# Patient Record
Sex: Male | Born: 1955 | Race: White | Hispanic: No | Marital: Married | Smoking: Former smoker
Health system: Southern US, Community
[De-identification: ages and names within clinical notes are randomized; demographics above are authoritative.]

## PROBLEM LIST (undated history)

## (undated) DIAGNOSIS — E669 Obesity, unspecified: Secondary | ICD-10-CM

## (undated) DIAGNOSIS — E119 Type 2 diabetes mellitus without complications: Secondary | ICD-10-CM

## (undated) DIAGNOSIS — I10 Essential (primary) hypertension: Secondary | ICD-10-CM

## (undated) DIAGNOSIS — C419 Malignant neoplasm of bone and articular cartilage, unspecified: Secondary | ICD-10-CM

## (undated) DIAGNOSIS — K219 Gastro-esophageal reflux disease without esophagitis: Secondary | ICD-10-CM

## (undated) HISTORY — DX: Essential (primary) hypertension: I10

## (undated) HISTORY — DX: Obesity, unspecified: E66.9

## (undated) HISTORY — PX: SKIN GRAFT FULL THICKNESS LEG: SUR1299

## (undated) HISTORY — PX: CHOLECYSTECTOMY: SHX55

## (undated) HISTORY — DX: Malignant neoplasm of bone and articular cartilage, unspecified: C41.9

## (undated) HISTORY — DX: Type 2 diabetes mellitus without complications: E11.9

## (undated) HISTORY — DX: Gastro-esophageal reflux disease without esophagitis: K21.9

## (undated) HISTORY — PX: HERNIA REPAIR: SHX51

---

## 1999-01-31 ENCOUNTER — Ambulatory Visit (HOSPITAL_COMMUNITY): Admission: RE | Admit: 1999-01-31 | Discharge: 1999-01-31 | Payer: Self-pay | Admitting: Internal Medicine

## 1999-05-12 ENCOUNTER — Encounter: Payer: Self-pay | Admitting: Internal Medicine

## 1999-05-12 ENCOUNTER — Ambulatory Visit (HOSPITAL_COMMUNITY): Admission: RE | Admit: 1999-05-12 | Discharge: 1999-05-12 | Payer: Self-pay | Admitting: Internal Medicine

## 1999-10-14 ENCOUNTER — Ambulatory Visit (HOSPITAL_COMMUNITY): Admission: RE | Admit: 1999-10-14 | Discharge: 1999-10-14 | Payer: Self-pay | Admitting: Internal Medicine

## 1999-10-14 ENCOUNTER — Encounter: Payer: Self-pay | Admitting: Internal Medicine

## 2000-04-26 ENCOUNTER — Encounter: Payer: Self-pay | Admitting: Internal Medicine

## 2000-04-26 ENCOUNTER — Ambulatory Visit (HOSPITAL_COMMUNITY): Admission: RE | Admit: 2000-04-26 | Discharge: 2000-04-26 | Payer: Self-pay | Admitting: Internal Medicine

## 2000-07-04 ENCOUNTER — Ambulatory Visit (HOSPITAL_COMMUNITY): Admission: RE | Admit: 2000-07-04 | Discharge: 2000-07-04 | Payer: Self-pay | Admitting: Neurology

## 2000-07-04 ENCOUNTER — Encounter: Payer: Self-pay | Admitting: Neurology

## 2000-07-13 ENCOUNTER — Ambulatory Visit (HOSPITAL_BASED_OUTPATIENT_CLINIC_OR_DEPARTMENT_OTHER): Admission: RE | Admit: 2000-07-13 | Discharge: 2000-07-13 | Payer: Self-pay | Admitting: Neurology

## 2000-08-08 ENCOUNTER — Encounter: Admission: RE | Admit: 2000-08-08 | Discharge: 2000-11-06 | Payer: Self-pay | Admitting: Pulmonary Disease

## 2000-11-10 ENCOUNTER — Encounter: Payer: Self-pay | Admitting: Internal Medicine

## 2000-11-10 ENCOUNTER — Ambulatory Visit (HOSPITAL_BASED_OUTPATIENT_CLINIC_OR_DEPARTMENT_OTHER): Admission: RE | Admit: 2000-11-10 | Discharge: 2000-11-10 | Payer: Self-pay | Admitting: Pulmonary Disease

## 2001-02-04 ENCOUNTER — Encounter: Admission: RE | Admit: 2001-02-04 | Discharge: 2001-05-05 | Payer: Self-pay | Admitting: Pulmonary Disease

## 2001-05-19 ENCOUNTER — Encounter: Admission: RE | Admit: 2001-05-19 | Discharge: 2001-08-17 | Payer: Self-pay | Admitting: Pulmonary Disease

## 2001-08-07 ENCOUNTER — Encounter: Payer: Self-pay | Admitting: Internal Medicine

## 2001-08-07 ENCOUNTER — Encounter: Admission: RE | Admit: 2001-08-07 | Discharge: 2001-08-07 | Payer: Self-pay | Admitting: Internal Medicine

## 2001-08-15 ENCOUNTER — Encounter: Admission: RE | Admit: 2001-08-15 | Discharge: 2001-11-13 | Payer: Self-pay | Admitting: Pulmonary Disease

## 2002-01-12 ENCOUNTER — Encounter: Admission: RE | Admit: 2002-01-12 | Discharge: 2002-04-12 | Payer: Self-pay | Admitting: Pulmonary Disease

## 2005-01-18 ENCOUNTER — Encounter: Admission: RE | Admit: 2005-01-18 | Discharge: 2005-04-18 | Payer: Self-pay | Admitting: Internal Medicine

## 2006-04-11 ENCOUNTER — Ambulatory Visit (HOSPITAL_COMMUNITY): Payer: Self-pay | Admitting: Psychiatry

## 2006-04-20 ENCOUNTER — Encounter: Payer: Self-pay | Admitting: Vascular Surgery

## 2006-04-20 ENCOUNTER — Inpatient Hospital Stay (HOSPITAL_COMMUNITY): Admission: EM | Admit: 2006-04-20 | Discharge: 2006-04-27 | Payer: Self-pay | Admitting: Emergency Medicine

## 2006-04-22 ENCOUNTER — Encounter (INDEPENDENT_AMBULATORY_CARE_PROVIDER_SITE_OTHER): Payer: Self-pay | Admitting: Cardiovascular Disease

## 2006-04-23 ENCOUNTER — Encounter (INDEPENDENT_AMBULATORY_CARE_PROVIDER_SITE_OTHER): Payer: Self-pay | Admitting: *Deleted

## 2006-05-06 ENCOUNTER — Ambulatory Visit (HOSPITAL_COMMUNITY): Payer: Self-pay | Admitting: Psychiatry

## 2006-07-08 ENCOUNTER — Ambulatory Visit (HOSPITAL_COMMUNITY): Payer: Self-pay | Admitting: Psychiatry

## 2006-08-16 ENCOUNTER — Encounter (HOSPITAL_BASED_OUTPATIENT_CLINIC_OR_DEPARTMENT_OTHER): Admission: RE | Admit: 2006-08-16 | Discharge: 2006-09-04 | Payer: Self-pay | Admitting: Internal Medicine

## 2006-09-01 ENCOUNTER — Inpatient Hospital Stay (HOSPITAL_COMMUNITY): Admission: EM | Admit: 2006-09-01 | Discharge: 2006-09-05 | Payer: Self-pay | Admitting: Emergency Medicine

## 2006-10-07 ENCOUNTER — Ambulatory Visit (HOSPITAL_COMMUNITY): Payer: Self-pay | Admitting: Psychiatry

## 2007-01-08 ENCOUNTER — Ambulatory Visit (HOSPITAL_COMMUNITY): Admission: RE | Admit: 2007-01-08 | Discharge: 2007-01-09 | Payer: Self-pay | Admitting: General Surgery

## 2007-01-08 ENCOUNTER — Encounter (INDEPENDENT_AMBULATORY_CARE_PROVIDER_SITE_OTHER): Payer: Self-pay | Admitting: Specialist

## 2007-01-23 ENCOUNTER — Ambulatory Visit (HOSPITAL_COMMUNITY): Admission: RE | Admit: 2007-01-23 | Discharge: 2007-01-23 | Payer: Self-pay | Admitting: Internal Medicine

## 2007-01-23 ENCOUNTER — Encounter: Payer: Self-pay | Admitting: Vascular Surgery

## 2007-02-24 ENCOUNTER — Ambulatory Visit (HOSPITAL_COMMUNITY): Payer: Self-pay | Admitting: Psychiatry

## 2007-03-28 ENCOUNTER — Encounter (HOSPITAL_BASED_OUTPATIENT_CLINIC_OR_DEPARTMENT_OTHER): Admission: RE | Admit: 2007-03-28 | Discharge: 2007-04-30 | Payer: Self-pay | Admitting: Surgery

## 2007-05-20 ENCOUNTER — Ambulatory Visit (HOSPITAL_COMMUNITY): Payer: Self-pay | Admitting: Psychiatry

## 2007-10-16 ENCOUNTER — Ambulatory Visit (HOSPITAL_COMMUNITY): Payer: Self-pay | Admitting: Psychiatry

## 2007-11-06 ENCOUNTER — Ambulatory Visit: Payer: Self-pay | Admitting: Pulmonary Disease

## 2007-11-11 ENCOUNTER — Ambulatory Visit (HOSPITAL_COMMUNITY): Admission: RE | Admit: 2007-11-11 | Discharge: 2007-11-11 | Payer: Self-pay | Admitting: Pulmonary Disease

## 2007-11-19 ENCOUNTER — Ambulatory Visit (HOSPITAL_COMMUNITY): Payer: Self-pay | Admitting: Psychiatry

## 2007-11-20 ENCOUNTER — Telehealth (INDEPENDENT_AMBULATORY_CARE_PROVIDER_SITE_OTHER): Payer: Self-pay | Admitting: *Deleted

## 2007-11-20 ENCOUNTER — Encounter: Payer: Self-pay | Admitting: Pulmonary Disease

## 2008-01-16 DIAGNOSIS — F329 Major depressive disorder, single episode, unspecified: Secondary | ICD-10-CM

## 2008-01-16 DIAGNOSIS — G4730 Sleep apnea, unspecified: Secondary | ICD-10-CM | POA: Insufficient documentation

## 2008-01-19 ENCOUNTER — Ambulatory Visit: Payer: Self-pay | Admitting: Pulmonary Disease

## 2008-02-17 ENCOUNTER — Ambulatory Visit (HOSPITAL_COMMUNITY): Payer: Self-pay | Admitting: Psychiatry

## 2008-07-15 ENCOUNTER — Ambulatory Visit (HOSPITAL_COMMUNITY): Payer: Self-pay | Admitting: Psychiatry

## 2008-11-10 ENCOUNTER — Ambulatory Visit (HOSPITAL_COMMUNITY): Payer: Self-pay | Admitting: Psychiatry

## 2009-02-28 ENCOUNTER — Encounter: Admission: RE | Admit: 2009-02-28 | Discharge: 2009-02-28 | Payer: Self-pay | Admitting: Internal Medicine

## 2009-03-01 ENCOUNTER — Inpatient Hospital Stay (HOSPITAL_COMMUNITY): Admission: AD | Admit: 2009-03-01 | Discharge: 2009-03-04 | Payer: Self-pay | Admitting: Internal Medicine

## 2009-05-13 ENCOUNTER — Ambulatory Visit (HOSPITAL_COMMUNITY): Payer: Self-pay | Admitting: Psychiatry

## 2009-05-26 ENCOUNTER — Inpatient Hospital Stay (HOSPITAL_COMMUNITY): Admission: EM | Admit: 2009-05-26 | Discharge: 2009-06-01 | Payer: Self-pay | Admitting: Emergency Medicine

## 2010-01-13 ENCOUNTER — Ambulatory Visit (HOSPITAL_COMMUNITY): Payer: Self-pay | Admitting: Psychiatry

## 2010-02-14 ENCOUNTER — Ambulatory Visit (HOSPITAL_COMMUNITY): Payer: Self-pay | Admitting: Psychiatry

## 2010-02-26 IMAGING — CT CT ABDOMEN W/ CM
2 of 5 series · 17 of 46 positions shown, 19 images · IV contrast (APPLIED)
Comparison: None available.

CLINICAL DATA: Abdominal pain.  Low grade fever.  Known hernia.

CT ABDOMEN WITH CONTRAST
TECHNIQUE: Multidetector CT imaging of the abdomen was performed
following the standard protocol during bolus administration of
intravenous contrast.
Contrast: 100 ml Fmnipaque-IDD.

[Series 2: abd/ with 5.0 b31f st · axial · 0.98mm/px · z∈[-264,+32]mm · 14 of 69 slices shown, 16 images]
[im 5/69  soft-tissue]
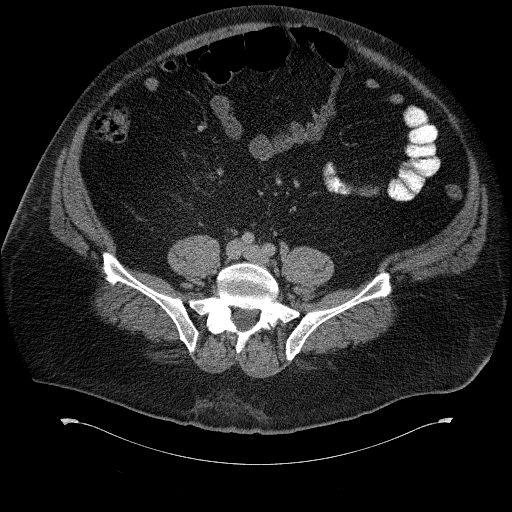
[im 5/69  bone]
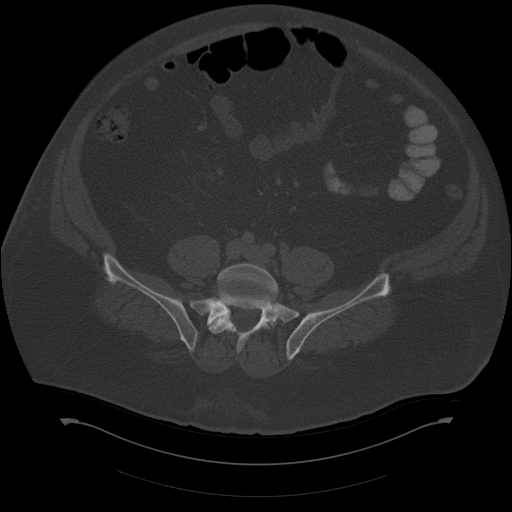
[im 10/69  soft-tissue]
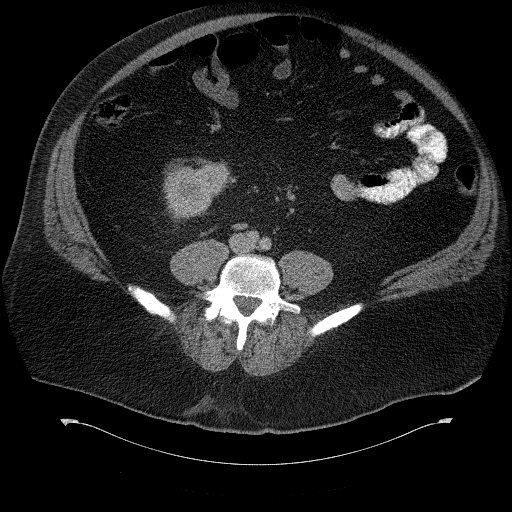
[im 14/69  soft-tissue]
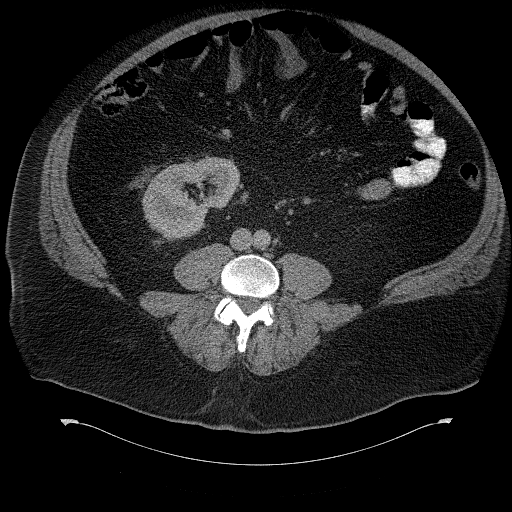
[im 19/69  soft-tissue]
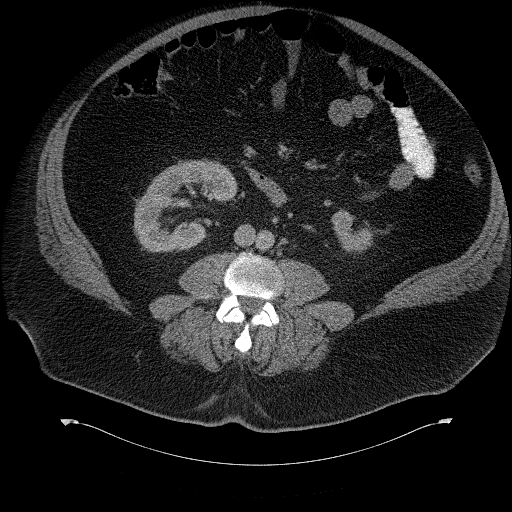
[im 23/69  soft-tissue]
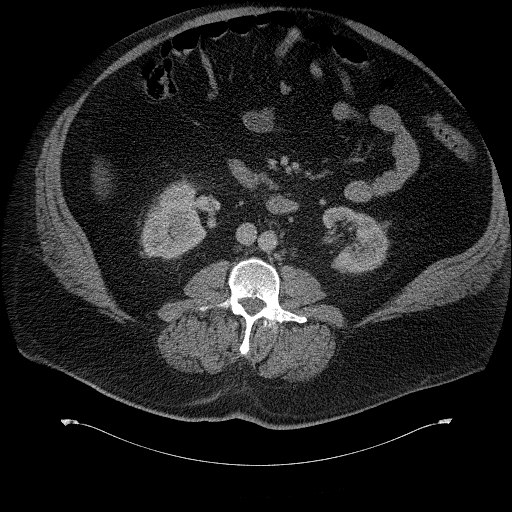
[im 28/69  soft-tissue]
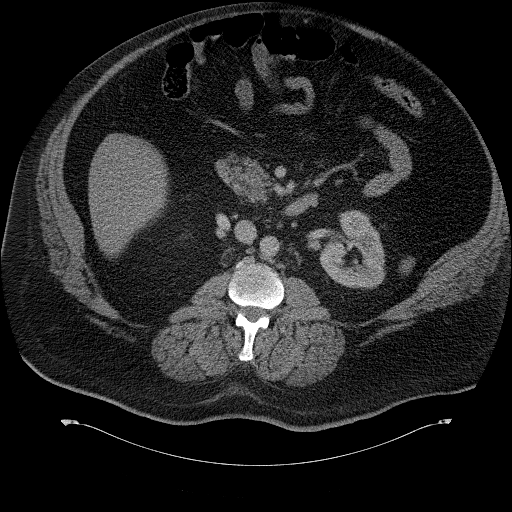
[im 32/69  soft-tissue]
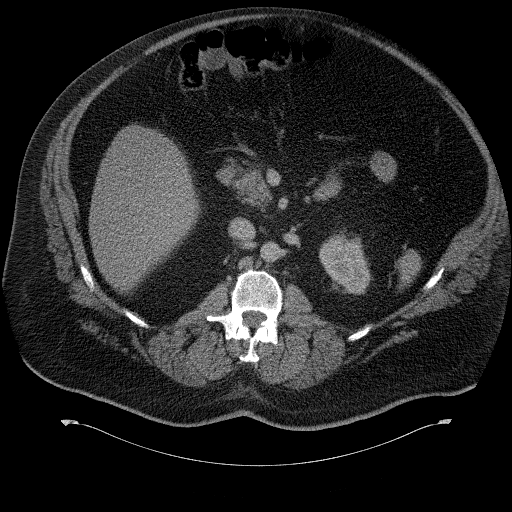
[im 37/69  soft-tissue]
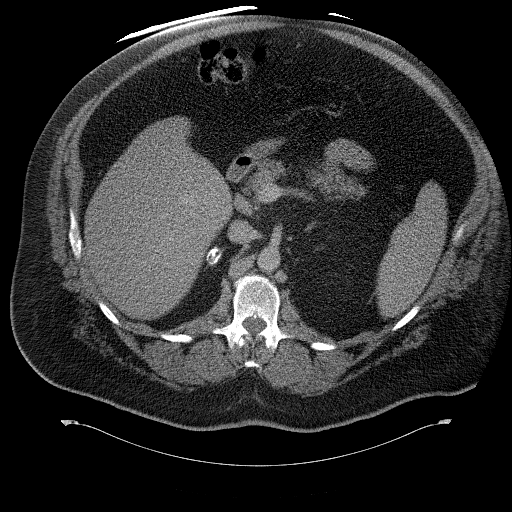
[im 41/69  soft-tissue]
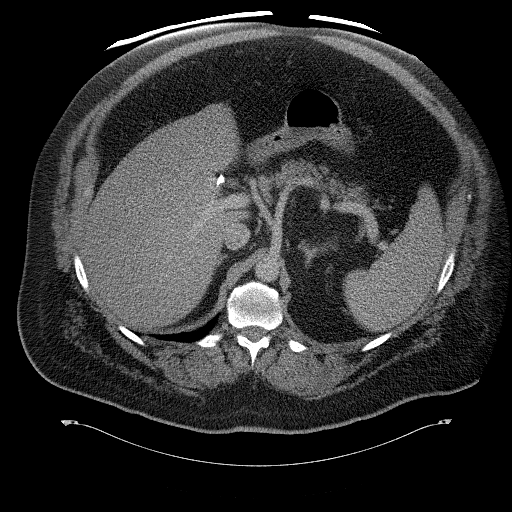
[im 41/69  bone]
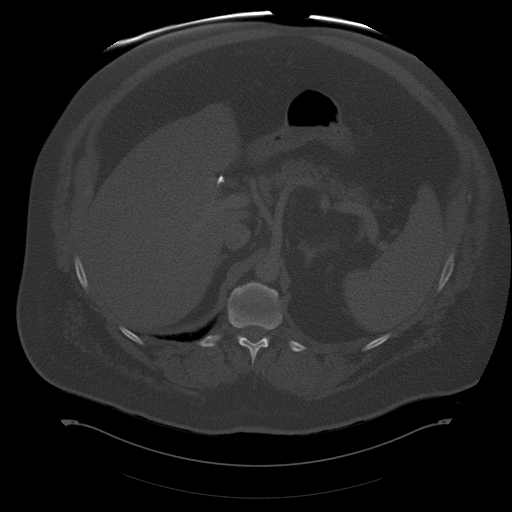
[im 46/69  soft-tissue]
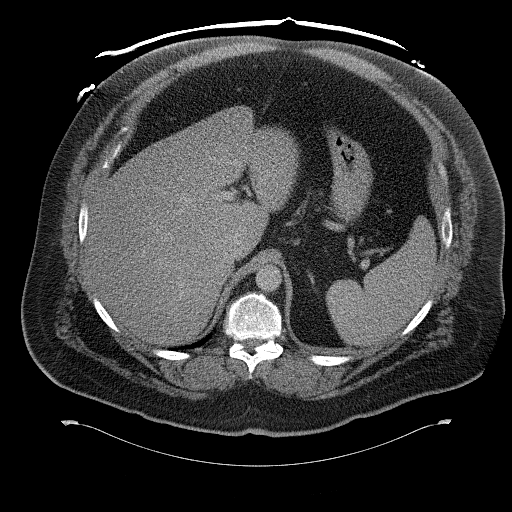
[im 50/69  soft-tissue]
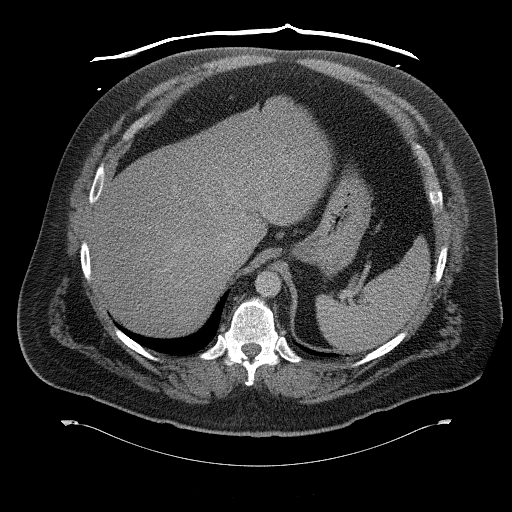
[im 55/69  soft-tissue]
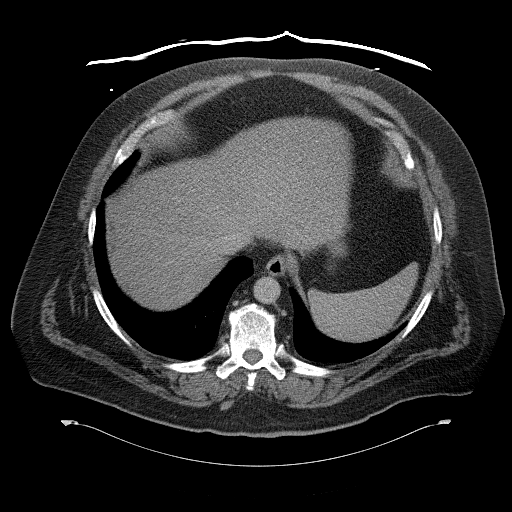
[im 59/69  soft-tissue]
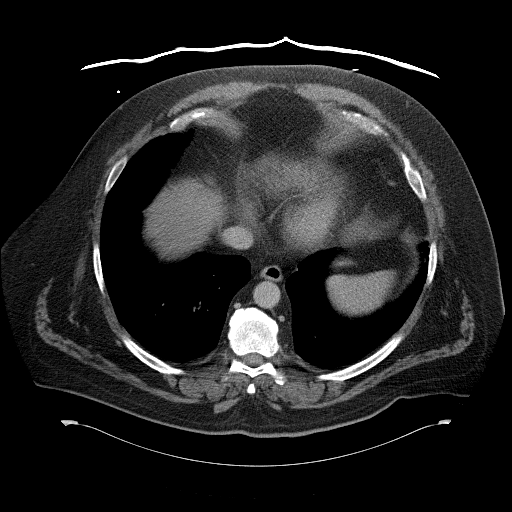
[im 64/69  soft-tissue]
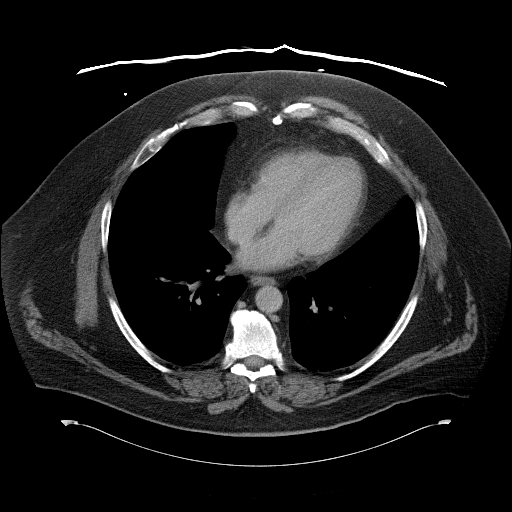

[Series 6: abd/ with 3.0 spo cor st · coronal · 0.98mm/px · 3 of 126 slices shown]
[im 42/126  soft-tissue]
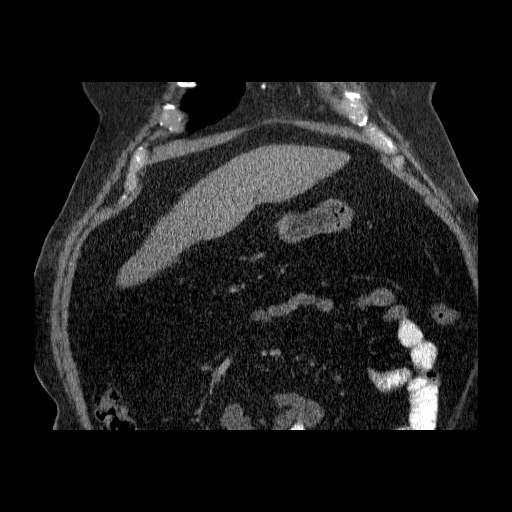
[im 56/126  soft-tissue]
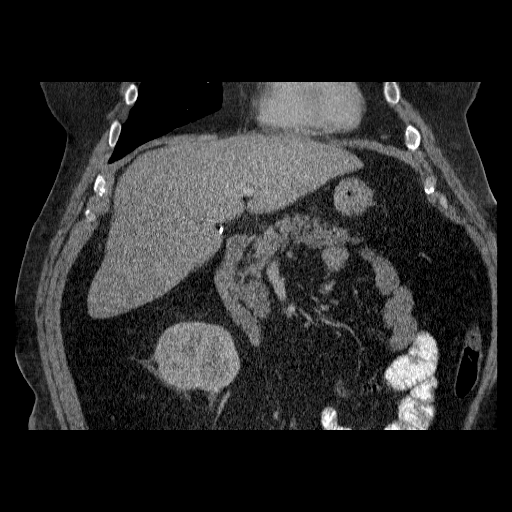
[im 70/126  soft-tissue]
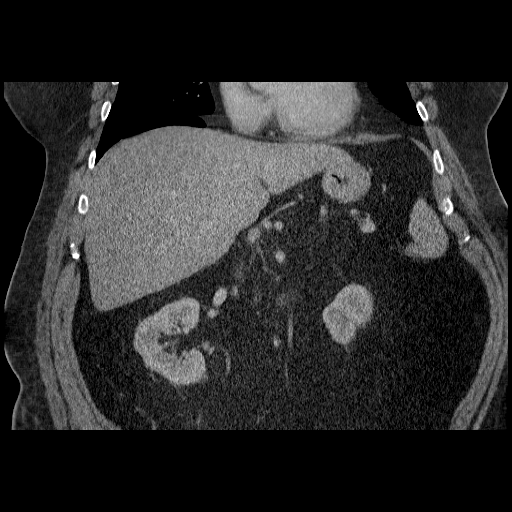

[17 of 46 positions shown; findings below may reference images not displayed]

FINDINGS: The lung bases are clear.  There is no pleural or
pericardial effusion.

No hernia is identified.  The patient is status post
cholecystectomy.  The liver, biliary tree, left adrenal gland,
spleen, pancreas and kidneys are unremarkable.  There is
calcification of the right adrenal gland consistent with the prior
infection or hemorrhage.  No abdominal lymphadenopathy or fluid.
Stomach and visualized bowel loops appear normal.  Degenerative
disease is seen in the lumbar spine with facet arthropathy at L5-S1
noted.
IMPRESSION: 1.  No hernia is identified.  No acute finding.
2.  Status post cholecystectomy.
3.  Calcification of the right adrenal gland consistent with remote
hemorrhage or infection.

## 2010-02-27 IMAGING — CT CT PELVIS W/ CM
1 series · 16 of 32 positions shown, 20 images · IV contrast (APPLIED)
Comparison: Ultrasound 03/02/2009

CLINICAL DATA: Abdominal pain.  Hernia.  Sore palpable abnormality
near the umbilicus.

CT PELVIS WITH CONTRAST
TECHNIQUE: Multidetector CT imaging of the pelvis was performed
following the standard protocol during administration of
intravenous contrast.
Contrast: 100 ml Mmnipaque-1DD

[Series 2: pelvis 5.0 b31f · axial · 0.98mm/px · z∈[-460,-180]mm · 16 of 62 slices shown, 20 images]
[im 4/62  soft-tissue]
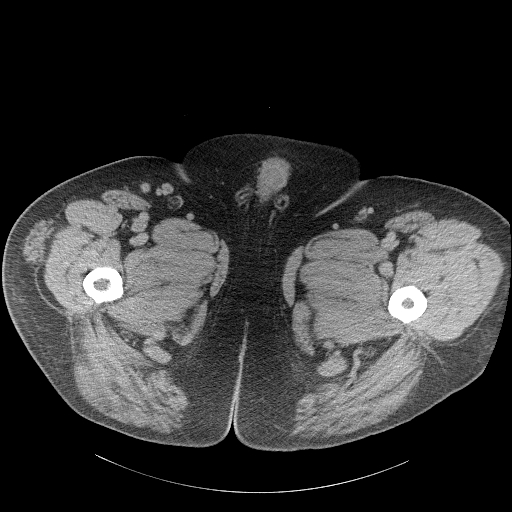
[im 4/62  bone]
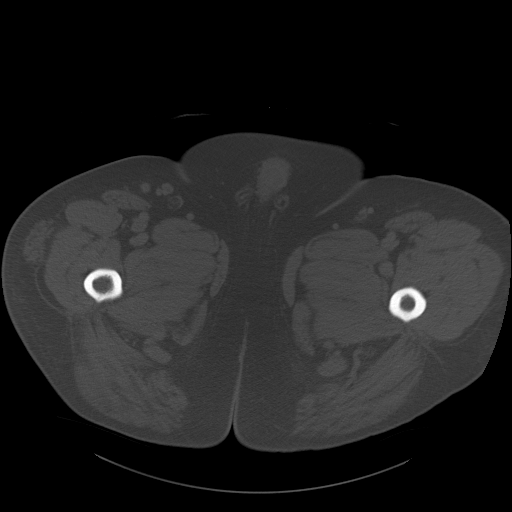
[im 8/62  soft-tissue]
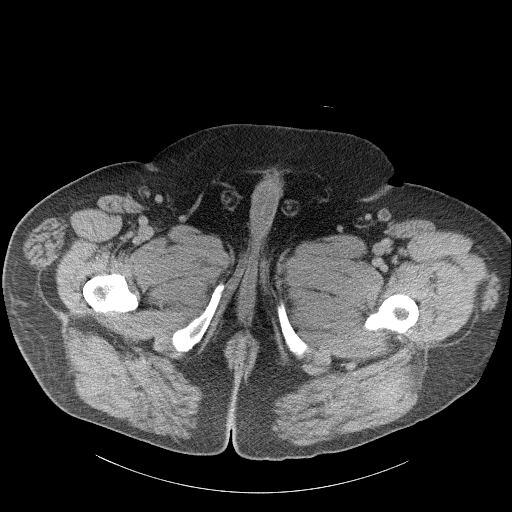
[im 12/62  soft-tissue]
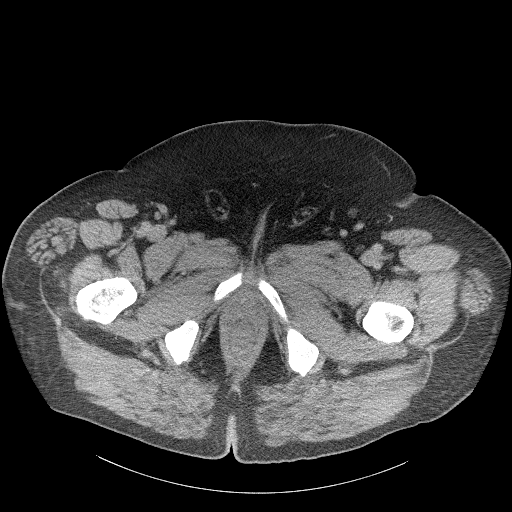
[im 16/62  soft-tissue]
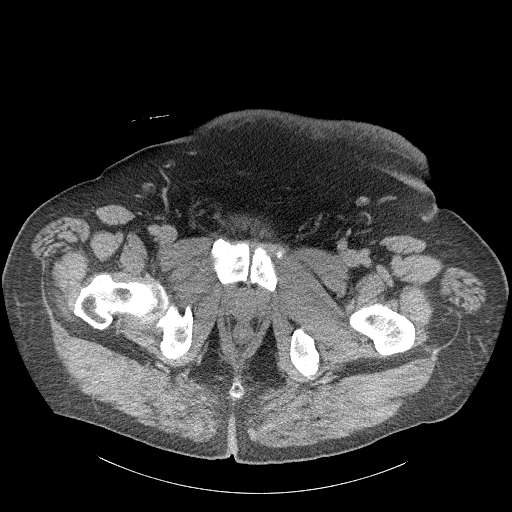
[im 20/62  soft-tissue]
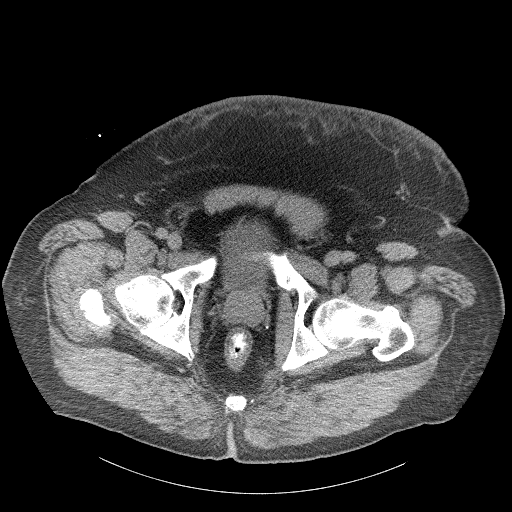
[im 24/62  soft-tissue]
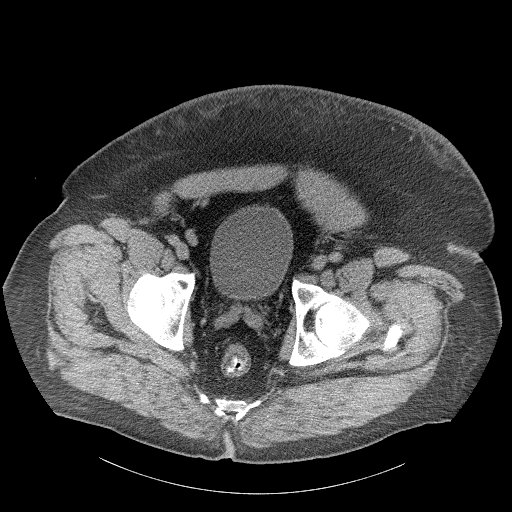
[im 28/62  soft-tissue]
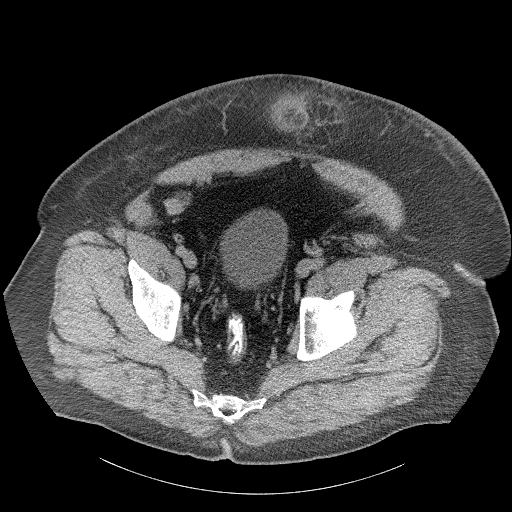
[im 34/62  soft-tissue]
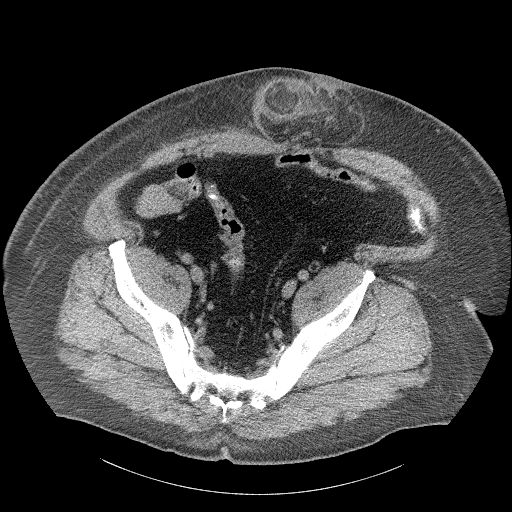
[im 38/62  soft-tissue]
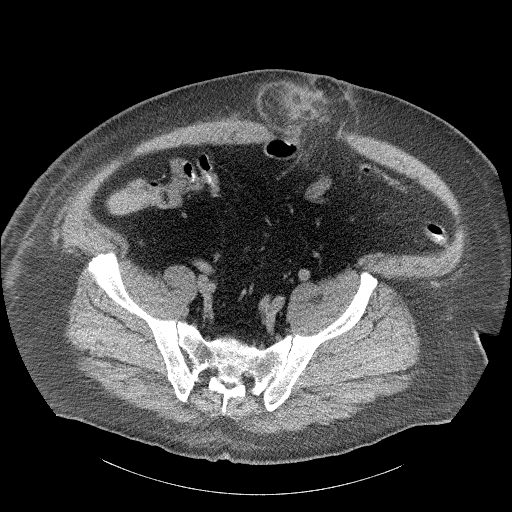
[im 38/62  bone]
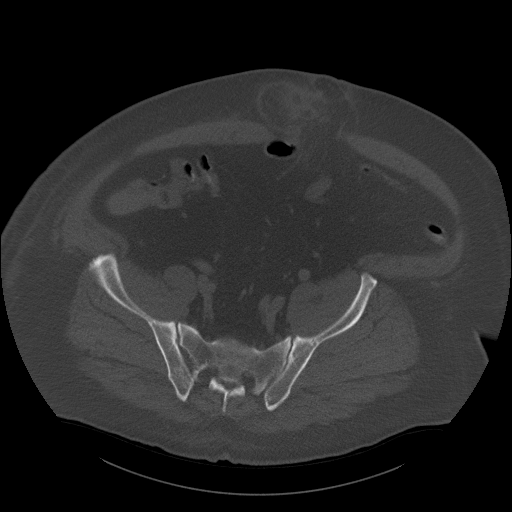
[im 42/62  soft-tissue]
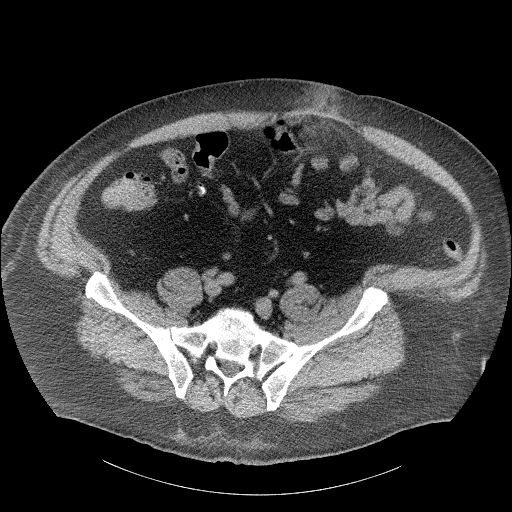
[im 46/62  soft-tissue]
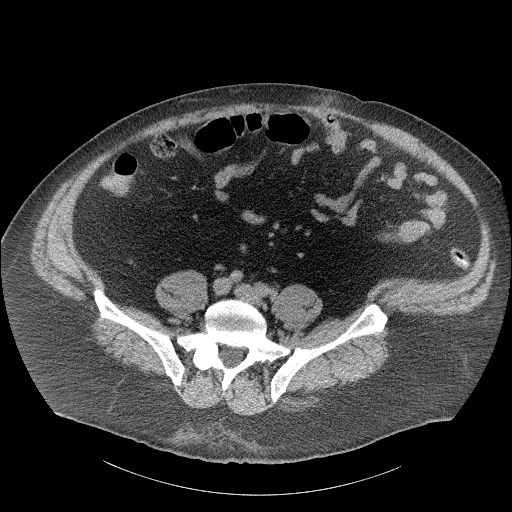
[im 50/62  soft-tissue]
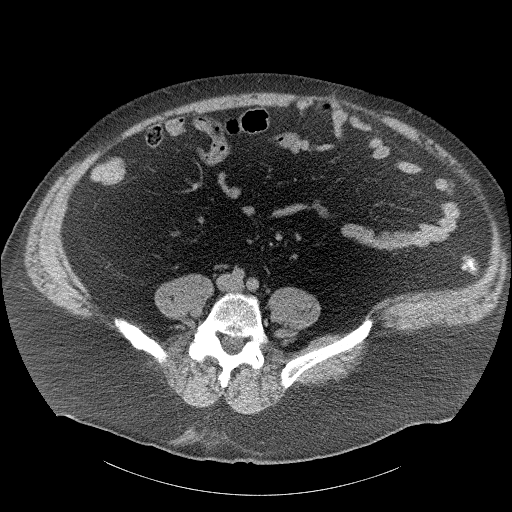
[im 54/62  soft-tissue]
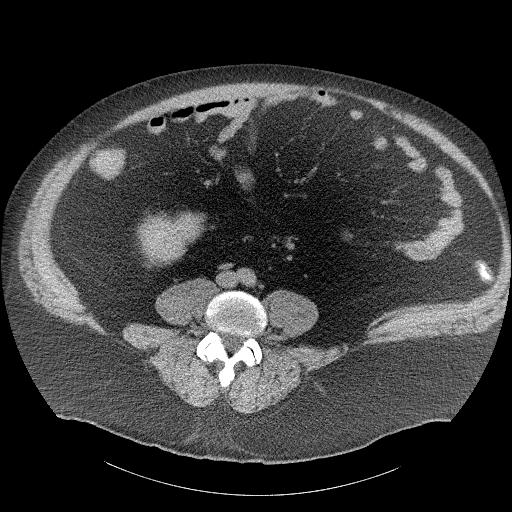
[im 54/62  lung]
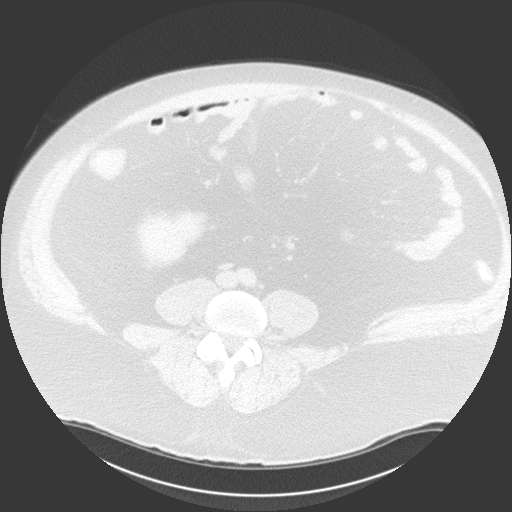
[im 56/62  lung]
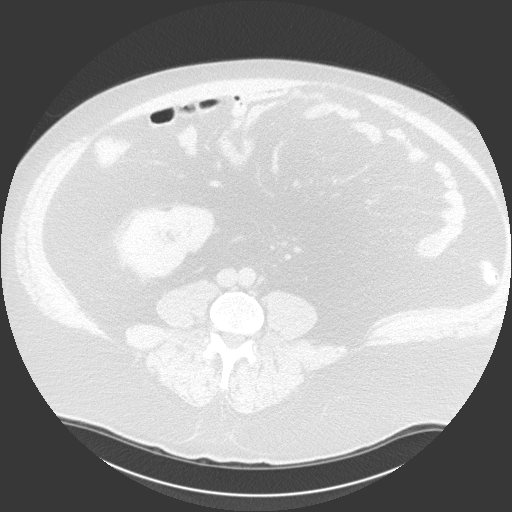
[im 58/62  soft-tissue]
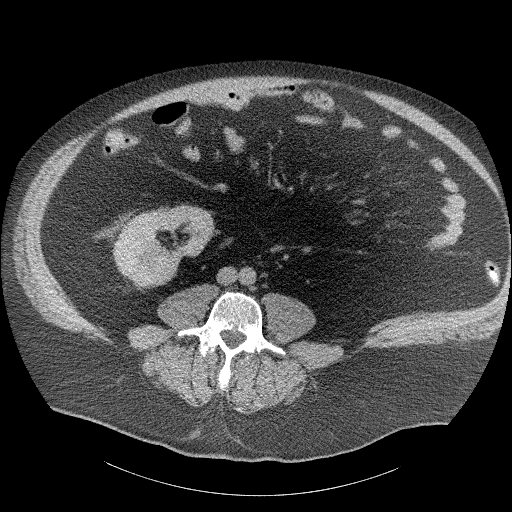
[im 58/62  lung]
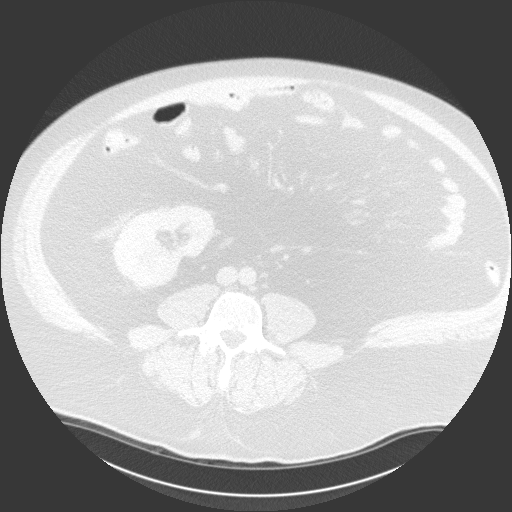
[im 60/62  lung]
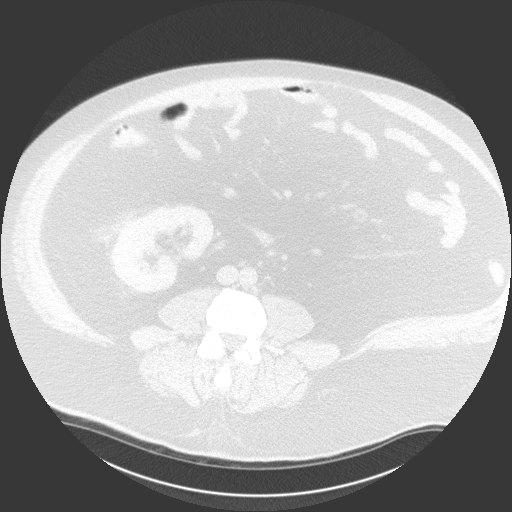

[16 of 32 positions shown; findings below may reference images not displayed]

FINDINGS: There is an umbilical hernia present containing fat.
Stranding noted within the fat in the hernia sac suggests
incarceration.  No bowel within the hernia sac.

Bowel grossly unremarkable.  No free fluid, free air, or adenopathy
in the pelvis.  Bladder unremarkable.

No acute bony abnormality.
IMPRESSION: Umbilical hernia containing fat.  Extensive edema/stranding within
the fat suggests incarceration.

## 2010-04-18 ENCOUNTER — Ambulatory Visit (HOSPITAL_COMMUNITY): Payer: Self-pay | Admitting: Psychiatry

## 2010-05-23 IMAGING — CR DG CHEST 2V
2 series · 2 of 2 positions shown · non-contrast
Comparison: 03/01/2009 and 11/11/2007 studies

CLINICAL DATA: History given of congestion, coughing, and shortness
of breath.

CHEST - 2 VIEW

[w chest pa]
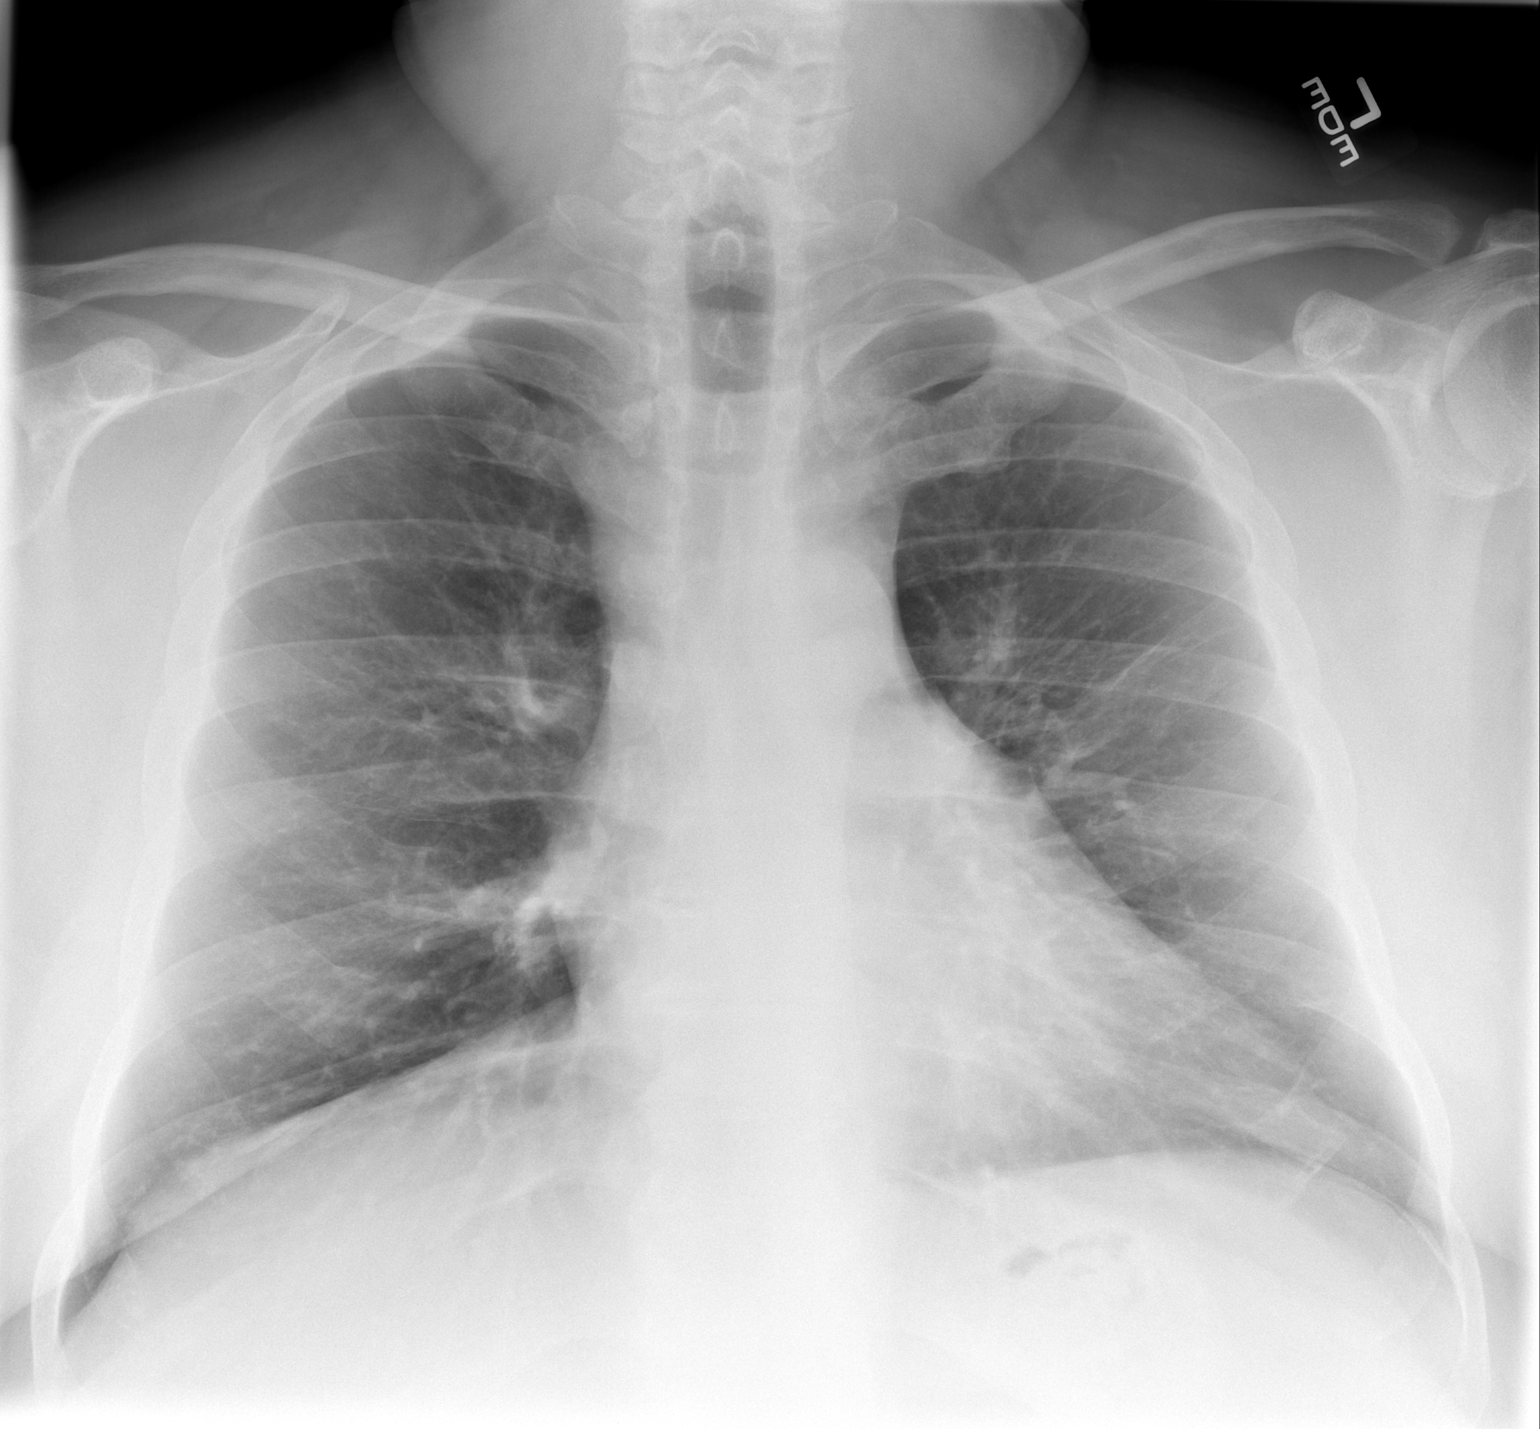

[w chest lat *]
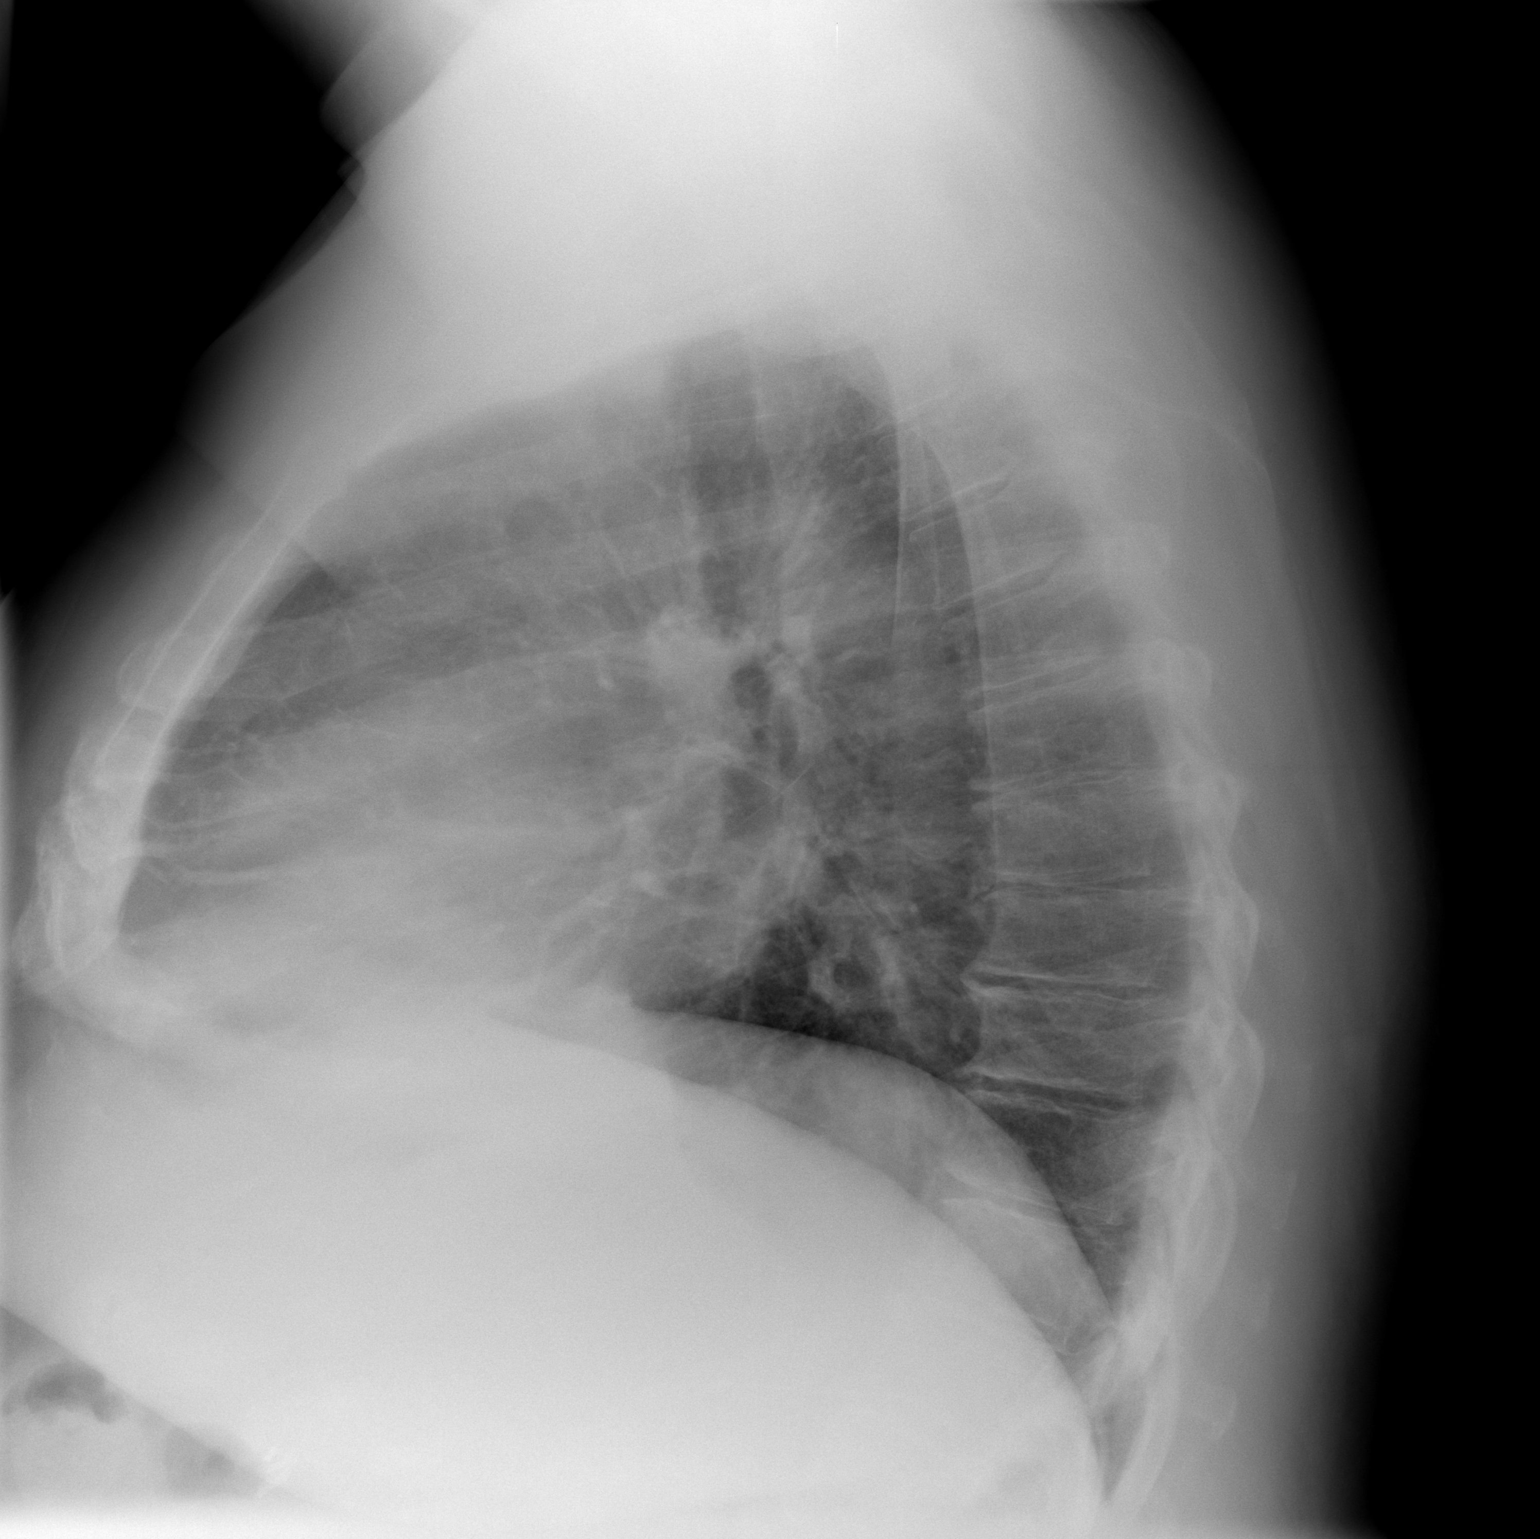

[2 of 2 positions shown; findings below may reference images not displayed]

FINDINGS: The cardiac silhouette is normal size and shape. No
pleural abnormality is evident. Linear fibrotic changes in the left
lung appear stable.  No acute pulmonary infiltrates are evident.
There is increase in the perihilar markings with central
peribronchial thickening.  This most commonly is associated with
bronchitis, reactive airway disease, or peribronchial pneumonitis.
There is mild degenerative spondylosis compatible with age.
IMPRESSION: Linear fibrotic changes in the left lung appear stable.  No acute
pulmonary infiltrates are evident. There is increase in the
perihilar markings with central peribronchial thickening.  This
most commonly is associated with bronchitis, reactive airway
disease, or peribronchial pneumonitis.

## 2010-10-13 ENCOUNTER — Ambulatory Visit (HOSPITAL_COMMUNITY): Payer: Self-pay | Admitting: Psychiatry

## 2010-10-13 ENCOUNTER — Emergency Department (HOSPITAL_COMMUNITY): Admission: EM | Admit: 2010-10-13 | Discharge: 2010-10-14 | Payer: Self-pay | Admitting: Emergency Medicine

## 2011-03-14 LAB — DIFFERENTIAL
Basophils Absolute: 0 10*3/uL (ref 0.0–0.1)
Lymphocytes Relative: 29 % (ref 12–46)
Lymphs Abs: 2 10*3/uL (ref 0.7–4.0)
Monocytes Relative: 11 % (ref 3–12)
Neutrophils Relative %: 58 % (ref 43–77)

## 2011-03-14 LAB — COMPREHENSIVE METABOLIC PANEL
Albumin: 3.5 g/dL (ref 3.5–5.2)
BUN: 12 mg/dL (ref 6–23)
CO2: 26 mEq/L (ref 19–32)
Chloride: 103 mEq/L (ref 96–112)
GFR calc Af Amer: >60 mL/min (ref >60)
Potassium: 3.7 mEq/L (ref 3.5–5.1)
Sodium: 136 mEq/L (ref 135–145)

## 2011-03-14 LAB — CBC
HCT: 42.9 % (ref 39.0–52.0)
Hemoglobin: 15.4 g/dL (ref 13.0–17.0)
MCHC: 35.9 g/dL (ref 30.0–36.0)
Platelets: 161 10*3/uL (ref 150–400)
RBC: 4.92 MIL/uL (ref 4.22–5.81)
WBC: 7.1 10*3/uL (ref 4.0–10.5)

## 2011-03-14 LAB — URINALYSIS, ROUTINE W REFLEX MICROSCOPIC
Bilirubin Urine: NEGATIVE
Specific Gravity, Urine: 1.03 (ref 1.005–1.030)
pH: 6 (ref 5.0–8.0)

## 2011-03-14 LAB — URINE MICROSCOPIC-ADD ON

## 2011-04-09 LAB — COMPREHENSIVE METABOLIC PANEL
ALT: 56 U/L — ABNORMAL HIGH (ref 0–53)
AST: 54 U/L — ABNORMAL HIGH (ref 0–37)
Albumin: 3.1 g/dL — ABNORMAL LOW (ref 3.5–5.2)
Albumin: 3.2 g/dL — ABNORMAL LOW (ref 3.5–5.2)
CO2: 30 mEq/L (ref 19–32)
Calcium: 8.8 mg/dL (ref 8.4–10.5)
Chloride: 103 mEq/L (ref 96–112)
Creatinine, Ser: 1.05 mg/dL (ref 0.4–1.5)
Creatinine, Ser: 1.09 mg/dL (ref 0.4–1.5)
GFR calc Af Amer: >60 mL/min (ref >60)
GFR calc non Af Amer: >60 mL/min (ref >60)
Glucose, Bld: 143 mg/dL — ABNORMAL HIGH (ref 70–99)
Sodium: 141 mEq/L (ref 135–145)
Total Protein: 6.3 g/dL (ref 6.0–8.3)

## 2011-04-09 LAB — GLUCOSE, CAPILLARY
Glucose-Capillary: 139 mg/dL — ABNORMAL HIGH (ref 70–99)
Glucose-Capillary: 148 mg/dL — ABNORMAL HIGH (ref 70–99)
Glucose-Capillary: 170 mg/dL — ABNORMAL HIGH (ref 70–99)

## 2011-04-10 LAB — NA AND K (SODIUM & POTASSIUM), 24 H UR: Potassium, 24H Ur: 23 mEq/d — ABNORMAL LOW (ref 25–125)

## 2011-04-10 LAB — LIPID PANEL
Cholesterol: 201 mg/dL — ABNORMAL HIGH (ref 0–200)
LDL Cholesterol: UNDETERMINED mg/dL (ref 0–99)
VLDL: UNDETERMINED mg/dL (ref 0–40)

## 2011-04-10 LAB — DIFFERENTIAL
Eosinophils Absolute: 0.2 10*3/uL (ref 0.0–0.7)
Lymphs Abs: 1.7 10*3/uL (ref 0.7–4.0)
Monocytes Relative: 9 % (ref 3–12)
Neutrophils Relative %: 68 % (ref 43–77)

## 2011-04-10 LAB — COMPREHENSIVE METABOLIC PANEL
ALT: 54 U/L — ABNORMAL HIGH (ref 0–53)
ALT: 60 U/L — ABNORMAL HIGH (ref 0–53)
AST: 42 U/L — ABNORMAL HIGH (ref 0–37)
AST: 46 U/L — ABNORMAL HIGH (ref 0–37)
Albumin: 3.2 g/dL — ABNORMAL LOW (ref 3.5–5.2)
Alkaline Phosphatase: 62 U/L (ref 39–117)
BUN: 11 mg/dL (ref 6–23)
CO2: 30 mEq/L (ref 19–32)
Calcium: 8.5 mg/dL (ref 8.4–10.5)
Calcium: 8.7 mg/dL (ref 8.4–10.5)
GFR calc Af Amer: >60 mL/min (ref >60)
GFR calc Af Amer: >60 mL/min (ref >60)
GFR calc non Af Amer: >60 mL/min (ref >60)
Glucose, Bld: 192 mg/dL — ABNORMAL HIGH (ref 70–99)
Potassium: 2.6 mEq/L — CL (ref 3.5–5.1)
Sodium: 138 mEq/L (ref 135–145)
Sodium: 139 mEq/L (ref 135–145)
Total Bilirubin: 0.4 mg/dL (ref 0.3–1.2)
Total Protein: 6 g/dL (ref 6.0–8.3)
Total Protein: 6.4 g/dL (ref 6.0–8.3)

## 2011-04-10 LAB — GLUCOSE, CAPILLARY
Glucose-Capillary: 143 mg/dL — ABNORMAL HIGH (ref 70–99)
Glucose-Capillary: 169 mg/dL — ABNORMAL HIGH (ref 70–99)
Glucose-Capillary: 178 mg/dL — ABNORMAL HIGH (ref 70–99)
Glucose-Capillary: 187 mg/dL — ABNORMAL HIGH (ref 70–99)
Glucose-Capillary: 187 mg/dL — ABNORMAL HIGH (ref 70–99)
Glucose-Capillary: 191 mg/dL — ABNORMAL HIGH (ref 70–99)
Glucose-Capillary: 238 mg/dL — ABNORMAL HIGH (ref 70–99)

## 2011-04-10 LAB — BASIC METABOLIC PANEL
BUN: 13 mg/dL (ref 6–23)
CO2: 31 mEq/L (ref 19–32)
CO2: 33 mEq/L — ABNORMAL HIGH (ref 19–32)
Calcium: 9 mg/dL (ref 8.4–10.5)
Chloride: 89 mEq/L — ABNORMAL LOW (ref 96–112)
GFR calc non Af Amer: 57 mL/min — ABNORMAL LOW (ref >60)
Glucose, Bld: 195 mg/dL — ABNORMAL HIGH (ref 70–99)
Glucose, Bld: 238 mg/dL — ABNORMAL HIGH (ref 70–99)
Glucose, Bld: 242 mg/dL — ABNORMAL HIGH (ref 70–99)
Potassium: 2.1 mEq/L — CL (ref 3.5–5.1)
Potassium: 2.9 mEq/L — ABNORMAL LOW (ref 3.5–5.1)
Sodium: 132 mEq/L — ABNORMAL LOW (ref 135–145)
Sodium: 134 mEq/L — ABNORMAL LOW (ref 135–145)

## 2011-04-10 LAB — POTASSIUM
Potassium: 2.2 mEq/L — CL (ref 3.5–5.1)
Potassium: <2 mEq/L — CL (ref 3.5–5.1)

## 2011-04-10 LAB — URINALYSIS, ROUTINE W REFLEX MICROSCOPIC
Bilirubin Urine: NEGATIVE
Glucose, UA: NEGATIVE mg/dL
Hgb urine dipstick: NEGATIVE
Nitrite: NEGATIVE
Specific Gravity, Urine: 1.012 (ref 1.005–1.030)
pH: 7.5 (ref 5.0–8.0)

## 2011-04-10 LAB — CBC
HCT: 46.2 % (ref 39.0–52.0)
Hemoglobin: 16.2 g/dL (ref 13.0–17.0)
MCV: 89.3 fL (ref 78.0–100.0)
RBC: 5.17 MIL/uL (ref 4.22–5.81)
WBC: 8.6 10*3/uL (ref 4.0–10.5)

## 2011-04-10 LAB — UIFE/LIGHT CHAINS/TP QN, 24-HR UR
Free Kappa Lt Chains,Ur: 4.2 mg/dL — ABNORMAL HIGH (ref 0.04–1.51)
Free Lambda Excretion/Day: 13.77 mg/d
Free Lambda Lt Chains,Ur: 0.36 mg/dL (ref 0.08–1.01)
Gamma Globulin, Urine: DETECTED — AB
Time: 24 hours
Total Protein, Urine-Ur/day: 195 mg/d — ABNORMAL HIGH (ref 10–140)

## 2011-04-10 LAB — HEMOGLOBIN A1C: Hgb A1c MFr Bld: 6.9 % — ABNORMAL HIGH (ref 4.6–6.1)

## 2011-04-11 ENCOUNTER — Encounter (HOSPITAL_COMMUNITY): Payer: Self-pay | Admitting: Physician Assistant

## 2011-04-12 LAB — COMPREHENSIVE METABOLIC PANEL
ALT: 27 U/L (ref 0–53)
ALT: 27 U/L (ref 0–53)
AST: 20 U/L (ref 0–37)
AST: 21 U/L (ref 0–37)
Alkaline Phosphatase: 88 U/L (ref 39–117)
CO2: 29 mEq/L (ref 19–32)
CO2: 30 mEq/L (ref 19–32)
Calcium: 8.4 mg/dL (ref 8.4–10.5)
Chloride: 100 mEq/L (ref 96–112)
Chloride: 103 mEq/L (ref 96–112)
GFR calc Af Amer: >60 mL/min (ref >60)
GFR calc non Af Amer: 57 mL/min — ABNORMAL LOW (ref >60)
GFR calc non Af Amer: >60 mL/min (ref >60)
Glucose, Bld: 158 mg/dL — ABNORMAL HIGH (ref 70–99)
Potassium: 4.2 mEq/L (ref 3.5–5.1)
Potassium: 4.2 mEq/L (ref 3.5–5.1)
Sodium: 136 mEq/L (ref 135–145)
Sodium: 139 mEq/L (ref 135–145)
Total Bilirubin: 0.6 mg/dL (ref 0.3–1.2)

## 2011-04-12 LAB — DIFFERENTIAL
Basophils Relative: 2 % — ABNORMAL HIGH (ref 0–1)
Eosinophils Absolute: 0.1 10*3/uL (ref 0.0–0.7)
Eosinophils Relative: 2 % (ref 0–5)
Lymphs Abs: 0.8 10*3/uL (ref 0.7–4.0)
Monocytes Relative: 7 % (ref 3–12)
Neutro Abs: 5.2 10*3/uL (ref 1.7–7.7)
Neutrophils Relative %: 72 % (ref 43–77)
Neutrophils Relative %: 78 % — ABNORMAL HIGH (ref 43–77)

## 2011-04-12 LAB — GLUCOSE, CAPILLARY
Glucose-Capillary: 102 mg/dL — ABNORMAL HIGH (ref 70–99)
Glucose-Capillary: 117 mg/dL — ABNORMAL HIGH (ref 70–99)
Glucose-Capillary: 128 mg/dL — ABNORMAL HIGH (ref 70–99)
Glucose-Capillary: 138 mg/dL — ABNORMAL HIGH (ref 70–99)
Glucose-Capillary: 189 mg/dL — ABNORMAL HIGH (ref 70–99)

## 2011-04-12 LAB — CBC
Hemoglobin: 14.2 g/dL (ref 13.0–17.0)
Hemoglobin: 14.2 g/dL (ref 13.0–17.0)
MCHC: 34.6 g/dL (ref 30.0–36.0)
RBC: 4.49 MIL/uL (ref 4.22–5.81)
RBC: 4.51 MIL/uL (ref 4.22–5.81)
WBC: 6.7 10*3/uL (ref 4.0–10.5)
WBC: 7.8 10*3/uL (ref 4.0–10.5)

## 2011-04-12 LAB — SEDIMENTATION RATE: Sed Rate: 38 mm/hr — ABNORMAL HIGH (ref 0–16)

## 2011-04-12 LAB — PROTIME-INR: Prothrombin Time: 13.3 seconds (ref 11.6–15.2)

## 2011-04-17 ENCOUNTER — Encounter (HOSPITAL_COMMUNITY): Payer: Self-pay | Admitting: Physician Assistant

## 2011-04-24 ENCOUNTER — Encounter (HOSPITAL_COMMUNITY): Payer: Self-pay | Admitting: Physician Assistant

## 2011-04-25 ENCOUNTER — Encounter (HOSPITAL_COMMUNITY): Payer: 59 | Admitting: Physician Assistant

## 2011-04-25 DIAGNOSIS — F319 Bipolar disorder, unspecified: Secondary | ICD-10-CM

## 2011-05-15 NOTE — Op Note (Signed)
NAMEDENG, KEMLER               ACCOUNT NO.:  [id number]   MEDICAL RECORD NO.:  [id number]          PATIENT TYPE:  INP   LOCATION:  4713                         FACILITY:  [facility]   PHYSICIAN:  Gabrielle Dare. Janee Morn, M.D.DATE OF BIRTH:  30-May-1955   DATE OF PROCEDURE:  03/03/2009  DATE OF DISCHARGE:                               OPERATIVE REPORT   PREOPERATIVE DIAGNOSIS:  Recurrent umbilical hernia.   POSTOPERATIVE DIAGNOSIS:  Recurrent umbilical hernia.   PROCEDURE:  Repair of recurrent umbilical hernia.   SURGEON:  Gabrielle Dare. Janee Morn, MD   ANESTHESIA:  General endotracheal.   HISTORY OF PRESENT ILLNESS:  Mr. Wafer is a 56 year old male who is  status post previous umbilical hernia repair by Dr. Luretha Murphy.  We  saw him in consultation in the hospital for a periumbilical mass, which  on CT showed recurrence of this hernia containing omentum.  He was  brought to the operating room for repair.   PROCEDURE IN DETAIL:  Informed consent was obtained.  The patient was  identified in the preop holding area and the site was identified.  He is  receiving intravenous antibiotics.  He was brought to the operating  room.  General endotracheal anesthesia was administered by the  anesthesia staff.  A transverse incision was made extending his previous  scar over towards the right side.  Subcutaneous tissues were dissected  down revealing a hernia sac.  This was circumferentially dissected.  The  hernia sac was then entered.  It contained omentum.  The hernia sac was  excised completely.  After dissecting and freeing up the underlying  omentum, the underlying omentum had a small area of necrosis.  This was  excised.  Then, the omentum was ligated to get excellent hemostasis.  The remainder of the omentum was viable.  This was freed  circumferentially off the fascia and reduced into the abdomen.  The  omentum was double checked to make sure there was good hemostasis.  The  fascia was  cleaned out circumferentially.   Due to the fact there were some necrotic omentum, it was not felt safe  to put any mesh.  The hernia repair was then completed with interrupted  #1 Novafil sutures achieving excellent closure.  The area was copiously  irrigated.  Hemostasis was ensured.  Subcutaneous tissues were  approximated with a running 2-0 Vicryl stitch, and the skin  was closed with staples.  Sponge, needle, and instrument counts were  correct.  Sterile dressing was applied.  The patient tolerated the  procedure well without apparent complications, and was taken to the  recovery room in stable condition.      Gabrielle Dare Janee Morn, M.D.  Electronically Signed     BET/MEDQ  D:  03/03/2009  T:  03/04/2009  Job:  [id number]   cc:   Minerva Areola L. August Saucer, M.D.

## 2011-05-15 NOTE — H&P (Signed)
NAME:  [name], [name]               ACCOUNT NO.:  [id number]   MEDICAL RECORD NO.:  [id number]          PATIENT TYPE:  INP   LOCATION:  3742                         FACILITY:  [facility]   PHYSICIAN:  Eric L. August Saucer, M.D.     DATE OF BIRTH:  [birth year]   DATE OF ADMISSION:  05/26/2009  DATE OF DISCHARGE:                              HISTORY & PHYSICAL   CHIEF COMPLAINT:  Progressive cough, severe hypokalemia.   HISTORY OF PRESENT ILLNESS:  One of several [facility]  admissions for this 55 year old married white male with longstanding  history of sleep apnea, morbid obesity, bipolar disorder with an early  diabetes mellitus.  The patient states that he had been having  increasing problems with cough and congestion intermittently for the  past month.  Over the past few weeks, he had a nonproductive cough with  increasing lower extremity edema.  He was then evaluated recently, was  noted to be mildly hypokalemic on the Lasix at 20 mg daily.  He was  placed on extra potassium of 40 mEq b.i.d.  The patient had not been  experiencing any diarrhea, nausea, or vomiting.  Over the past week, he  noted increasing weakness with fatigue.  He noted that he was not able  to rest supine at night as well.  He also experienced increasing nasal  congestion with postnasal drainage.  The patient tried over-the-counter  cough suppressants without significant relief.  He subsequently came to  the emergency room because of his nasal congestion and coughing.  Evaluation at that time demonstrated a potassium level of less than 2.  The patient is subsequently being admitted at this time for correction  and further evaluation.   PAST MEDICAL HISTORY:  Notable for:  1. Bipolar disorder.  2. History of seizure disorder in the past.  3. History of sleep apnea as well.  4. Longstanding history of morbid obesity, hypertension as noted.   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  As of  March 2010, he undergone a correction of an umbilical hernia per Dr.  Janee Morn, which was done without complications.   Past medical history otherwise notable for distant abdominal surgery in  the past with recurrent hernia, that was done by Dr. Maryagnes Amos by record.  Medical history also significant for sleep apnea, which he is presently  being more compliant with the use of his CPAP machine at this time.   REVIEW OF SYSTEMS:  As noted above.  The patient has had occasional  nausea without vomiting.  He notes signs of stiffness in his joints,  notably lower legs.  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  Chronic history of obesity as  noted.   ALLERGIES:  The patient is allergic to DEPAKOTE.  He also is allergic to  [facility], PAXIL, and PROZAC.  He is allergic to [facility] and TOPAMAX as  well.   PRESENT MEDICATIONS:  Of note, the patient did not bring  his medications  in for review.  He has most recently been discharged on:  1. Omeprazole 20 mg daily.  2. Potassium chloride presently at 40 mEq b.i.d.  3. Felodipine 10 mg daily.  4. Lisinopril 40 mg daily.  5. Metformin 500 mg daily.  6. Budeprion SR 150 mg b.i.d.  7. Gabapentin 300 mg b.i.d.  8. Lecithin 1200 mg daily.  9. Omega-3, fish oils 1000 mg b.i.d.  10.Vitamin C 1000 mg daily.  11.Vitamin B1 one tablet daily.  12.L-Lysine 1000 mg daily.  13.Benadryl 25 mg q.4 h. p.r.n.  14.Melatonin 3 mg, he was taking 5 tablets at nighttime for sleep.  15.The patient also had Asmanex inhaler 1-2 puffs b.i.d.  16.Furosemide 20 mg daily.   Unclear as to his other herbal supplements that the patient had been  taking.  Family will bring this in for review.   PHYSICAL EXAMINATION:  GENERAL:  He is a well-developed, morbidly obese  white male presently in no acute distress.  VITAL SIGNS:  Blood pressure 134/75,  pulse 91, respiratory rate 20,  temperature 98.3, and O2 sats 99%.  HEENT:  Head, normocephalic and atraumatic without bruits.  Extraocular  muscle intact.  No scleral icterus.  No sinus tenderness.  TMs with  decreased light reflex without erythematous changes.  NECK:  Supple.  No posterior cervical nodes.  LUNGS:  Distant breath sounds.  No E:A changes appreciated.  No rhonchi  noted.  CARDIAC:  Normal S1 and S2.  No S3, no S4, 1/6 sytolic ejection murmur  in the left sternal border.  No rub appreciated.  ABDOMEN:  Obese.  Bowel sounds are present.  Minimal epigastric  tenderness.  No suprapubic tenderness.  No masses appreciated.  EXTREMITIES:  Full passive range of motion in upper extremities.  He has  pressure support stockings on at this time.  Baseline chronic erythema  in the legs.  Negative Homans bilaterally.  Pulses are 1+  SKIN:  As noted above.  Scattered acneiform lesions on his back as well.  NEUROLOGIC:  He is alert and oriented x3.  Cranial nerves were intact.  Negative drift.   LABORATORY DATA:  Chemistry notable for sodium 134, potassium less than  2, chloride 87, CO2 23, BUN is 15, creatinine 1.20, glucose 195, and  calcium 9.0.  BNP was less than 30.  Urinalysis, pH of 7.5, specific  gravity 1.012, otherwise negative.  EKG notably pending.  Chest x-ray  was obtained in the emergency room.  It demonstrated some linear  fibrotic changes in the left lung base, which appeared stable.  No acute  infiltration was seen.  Slight increase in perihilar markings with  central peribronchial thickening suggestive of bronchitis.   IMPRESSION:  1. Hypokalemia, symptomatic.  This is questionably secondary to      excessive diuresis.  He does not give a history of excessive loss,      i.e., GI.  We will need to exclude underlying other causes like      adrenal tumor other that might promote hypokalemia.  We will need      to exclude excessive sensitivity to his furosemide.  I can  exclude      association with certain psychotropic medications as well.  2. Bronchitis, acute on chronic.  This is somewhat mild by x-ray      appearance.  He has been symptomatic, but there had been      nonproductive cough.  We will need followup on this as well.  3. Sleep apnea.  He has been more compliant with the continuous      positive airway pressure machine.  4. Morbid obesity.  He has not lost any significant degree of weight.  5. Bipolar disorder.  Presently, relatively stable on medication.  6. Type 2 diabetes mellitus.  Apparently mild.  7. Degenerative joint disease.   PLAN:  The patient is admitted for further evaluation.  We will undergo  runs of potassium for correction.  Obtain cortisol, aldosterone levels  as well.  We will monitor intake of his medication and diet.  Hold  excessive diuresis at this time.  We will place him on Rocephin  empirically for the bronchitis at this time.  Continue his PPI agents  for his reflux symptoms.  Further therapy pending response to the above.  We will continue to monitor his blood sugars, control his psychotropic  medications and further therapy thereafter.           ______________________________  Lind Guest. August Saucer, M.D.     ELD/MEDQ  D:  05/26/2009  T:  05/27/2009  Job:  [id number]

## 2011-05-15 NOTE — Consult Note (Signed)
NAME:  [name], [name]               ACCOUNT NO.:  [id number]   MEDICAL RECORD NO.:  [id number]          PATIENT TYPE:  INP   LOCATION:  3742                         FACILITY:  [facility]   PHYSICIAN:  Eric L. August Saucer, M.D.     DATE OF BIRTH:  [birth year]   DATE OF CONSULTATION:  DATE OF DISCHARGE:                                 CONSULTATION   REASON FOR CONSULTATION:  Hypokalemia.   This is a 56 year old white male who has a history notable for  hypertension, morbid obesity, bipolar disorder, and non-insulin-  requiring diabetes type 2.  He was admitted via the emergency department  on May 26, 2009 with complaints of nasal congestion and coughing that  had not responded to outpatient therapy.  His laboratory in the  emergency department was notable for profound hypokalemia with a serum  potassium less than 2 and venous CO2 of 33.  He was subsequently  therefore admitted primarily for management of his hypokalemia, but has  been treated for his cough, sinus congestion, etc., as well.   I have no other access to potassium data other than a March 2010  admission when his potassium was 4.2 (I believe he was on lisinopril and  perhaps Lasix at that time, but it is unclear).  By his report and that  of his wife,  he was recently mildly hypokalemic (I do not have access  to that lab), and his potassium replacement was doubled from 20 to 40  mEq twice a day.  His wife thinks his potassium was 2.2.   He also had during a hospitalization in February 2008 a potassium which  was as low as 2, and based on the discharge summary had been on  Zaroxolyn at that time, but there are no other available details.   His wife who is a nurse said he has had at least 3 different incidences  in the hospital where his potassium was profoundly low.   With regard to his current hospitalization since his admission, his  furosemide (20 mg a day) was discontinued, spironolactone was added, and  I believe he has  received approximately 540 mEq of potassium IV and p.o.  since admission (appears he received at least 80 mEq IV and 460 mEq p.o.  as charted on the medication record)   As a part of his workup a plasma renin was ordered, but was not put in  the computer correctly and therefore was not done.  Plasma aldosterone  level is pending.  A 24-hour urine potassium was collected on May 29,  and this was done during the time when he was being repleted and was on  spironolactone.  It showed only 23 mEq of potassium excreted in a 24-  hour collection, and the spot urine potassium was 6 mEq/L which is about  as efficient as the kidneys can be in terms of holding onto potassium.  As previously noted his Lasix was discontinued.  He has had a serum  cortisol measured which is normal, and his serum magnesium levels have  been normal.   He denies the use  of licorice as an outpatient.  He has not used more  than the prescribed dose of Lasix, and does not report any particular  unusual dietary habits.  His wife says he has had diarrhea off and on  over the past several years, but was not having diarrhea prior to the  current admission.  They do note that he sweats profusely during the  day.   PAST MEDICAL HISTORY:  1. History of recurrent hypokalemia - per wife x3 episodes as noted in      the HPI; documented in February 2008 at a time when he was on      Zaroxolyn; was on Lasix during his current admission, and I do not      have details about the third episode.  2. Morbid obesity (the patient says his weight problems started when      he was placed on Topamax for bipolar disorder in 1999 and went from      a size 32 to a size 64).  3. Hypertension.  4. Diabetes type 2 on metformin with A1c in the 6.8-7 range.  5. Bipolar disorder diagnosed in 1998 - has been tried on various      medications including Topamax Zyprexa, Prozac, Paxil, Depakote,      Klonopin, Restoril, Nembutal, and Aricept.  He is  presently      maintained on Neurontin and Wellbutrin.  6. History of a liposarcoma of the right leg resected by Dr. Elesa Massed at      [facility] in 2006.  7. History of precancerous cells on colonoscopy in 2008 by Dr. Elnoria Howard.  8. History of cholecystectomy and umbilical hernia repair in 2007 by      Dr. Daphine Deutscher.  9. History of recurrent intermittent diarrhea, but none recent.  10.Incarcerated hernia repair with mesh March 2010.  11.Fatty liver by ultrasound September 2007.  12.Dyslipidemia.  13.Chronic lower extremity cellulitis/dermatitis.   CURRENT MEDICATIONS:  1. Plendil 5 mg a day.  2. Protonix 40 mg a day.  3. Neurontin 300 mg t.i.d.  4. Wellbutrin 150 mg b.i.d.  5. Spironolactone 50 mg b.i.d.  6. Lantus insulin.  7. Lovenox.  8. Sliding scale insulin.  9. Rocephin.  10.K-Dur which is now at a dose of 20 mEq b.i.d.  11.Ambien.   FAMILY HISTORY:  Is somewhat sketchy.  Both of his parents died at young  ages, and he was one of 6 siblings who were placed in foster homes.  He  does note that his father had asbestosis, but knows nothing about his  other family history.   SOCIAL HISTORY:  The patient is married to his second wife who is a  Engineer, civil (consulting).  They are both on disability from bipolar disorder.  He is very  energetic around the house.  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  He is disabled as previously noted by  his bipolar disease.  He previously smoked a carton of cigarettes a day  from the time he was 19 until he was in his mid 62s. Similarly he used  all hard drugs with the exception of IV drugs, but has not used those  since about age 71.  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  He finished high school, but  has a learning disability.  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   REVIEW OF SYSTEMS:  At the present time he denies chest pain or  shortness of breath, but he does have dyspnea on exertion with coughing  more chronically.  He has had no nausea, vomiting or abdominal pain.  No  diarrhea recently, although has it intermittently, no current  constipation.  He reports some leg swelling at home, but this is better  in the hospital.  He is emotionally quite labile, and gets a little loud  and boisterous, and his wife says this is usual for him.  He had some  decreased hearing in the right ear.  He reports that his abdomen is  getting bigger above the umbilicus (I don't want people to think I'm  crazy.  I just want them to pay attention.).  He has excessive  sweating, and his wife reports that he will wake up and in 2 hours after  he wakes up in the morning his clothes are drenched.   PHYSICAL EXAMINATION:  He is a brusque gentleman.  He speaks loudly and  gets mildly agitated when asked probing questions (he explains this is  secondary to his underlying frustration with his medical problems).  He is afebrile at 97.9, blood pressure 132/85, O2 saturation 100% on  room air.  He is a large-framed white male.  I really cannot visualize  his neck veins because of his body habitus.  His lung fields are grossly clear.  Heart sounds are distant heard best subxiphoid position.  S1, S2.  I  cannot hear any S3, S4 or murmur.  He has a very large abdomen which is in fact more protuberant above the  level of the umbilicus.  He has scars from his previous hernia repairs  and cholecystectomy.  Bowel sounds are normally active.  There is no  tenderness.  I cannot definitely feel his liver, but the size of his  abdomen makes the exam difficult.  His lower extremities show chronic cellulitic/dermatitic changes with  some woody edema and a small amount of pitting.   LABORATORIES:  As of today shows a sodium of 140, potassium 4.4,  chloride 103, CO2 30, BUN 11,  creatinine 1.09.  SGOT and PT mildly  elevated at 49 and 56, respectively.  Hemoglobin A1c 6.9.  Urinalysis  negative (urine pH 7.5 at a time when his venous CO2 was 33 and  creatinine was 1.2).  Twenty-hour urine potassium (done during IV and  p.o. replacement, was also on spironolactone at the time) showed 23 mEq  of potassium for 24 hours with a spot concentration of 6 mEq/L.  Cortisol was normal at 10.9.   IMPRESSION:  Profound hypokalemia in a 56 year old gentleman who reports  at least 3 episodes in the past, 2 of which were associated with  diuretic use, although on the current admission potassium was less than  2 and he was only on 20 mEq a day of Lasix.  His hypokalemia has been  corrected with aggressive replacement of potassium as well as  spironolactone as a potassium-sparing drug.  It does appear that he had  a metabolic alkalosis with his hypokalemia on admission.  He was on  Lasix, but was on a very low dose and denies excessive use.  This would  certainly raise the question of an underlying hyperaldosteronism.   Contrary to that, however, his urinary studies show potassium avidity by  the  kidney.  However, the urinary studies are colored by the fact that  the patient was on an aldosterone antagonist when the 24-hour urine and  spot urine potassiums were done.   Therefore I believe the hyperaldosteronism still needs to be excluded,  and he does have a serum aldosterone level pending.   Bartter's Syndrome or Gitelman's Syndrome, a couple of the less likely  etiologies for hypokalemia, are less likely in this patient because his  serum magnesium is normal.   He may be having some occasional extrarenal potassium losses through his  GI tract ,but he has not had these recently and his wife does report  that he has excessive sweating - sweat contains 5-10 mEq/L of potassium,  but this would require liters of sweat.   In short, I am not sure how he became so total body  potassium depleted.  Therapeutically he has responded well to IV and oral potassium and  spironolactone.  Diagnostically he is a little bit of a mystery.   RECOMMENDATIONS:  1. Therapeutically would continue with Aldactone and potassium      replacement.  2. Agree that potassium-wasting drugs should be avoided in the future      if possible.  3. If his serum aldosterone is increased, he will need imaging focused      on the adrenal glands.  Of note, he did have a CT scan in March      2010 which showed calcification of the right adrenal gland      consistent with either infection or remote hemorrhage, but it      raises the question as to whether something else may be operative      here.  4. I doubt that he has a tubular potassium defect, but studies were      done while on an aldosterone antagonist, so I cannot really comment      on this.  5. If there is no adrenal issue identified, he probably would deserve      a CT angiogram to rule out renal artery stenosis as a cause of      hypokalemia associated with hypertension and metabolic alkalosis.  6. If he does not have an adrenal issue or renal artery stenosis, we      may not know why his potassium has been low, and it may simply be a      matter of monitoring and keeping him on potassium-sparing drugs as      well as diuretics.   Thanks for asking [place] to see [place] this patient.  His problem is an  interesting one. His workup certainly could be completed as an  outpatient if he is medically ready to go home otherwise.  He will  require careful follow-up of his electrolytes.      Duke Salvia Eliott Nine, M.D.  Electronically Signed     ______________________________  Lind Guest. August Saucer, M.D.    CBD/MEDQ  D:  05/31/2009  T:  05/31/2009  Job:  [id number]

## 2011-05-15 NOTE — Consult Note (Signed)
NAMECAYLOR, TALLARICO               ACCOUNT NO.:  [id number]   MEDICAL RECORD NO.:  [id number]          PATIENT TYPE:  INP   LOCATION:  4713                         FACILITY:  [facility]   PHYSICIAN:  Gabrielle Dare. Janee Morn, M.D.DATE OF BIRTH:  [birth year]   DATE OF CONSULTATION:  03/02/2009  DATE OF DISCHARGE:                                 CONSULTATION   CONSULTING SURGEON:  Dr. Janee Morn.   REQUESTING PHYSICIAN:  Eric L. August Saucer, MD   REASON FOR CONSULTATION:  Umbilical hernia.   HISTORY OF PRESENT ILLNESS:  Mr. Vore is a 53-year male patient with  multiple medical problems, morbidly obese, who was admitted after  complaining of several days of umbilical pain and lower abdominal skin  redness with the pain beginning this past Sunday.  The initial concern  was that the patient had some sort of abdominal wall abscess.  So, he  was admitted and placed on empiric Zosyn IV antibiotic therapy.  An  ultrasound was performed that did not reveal an abscess but questioned  of possible umbilical hernia.  CT was recommended to clarify and the CT  of the pelvis was done later this afternoon that demonstrated an area of  fat that was incarcerated into the umbilical area associated stranding  of this tissue.  In talking with the patient, he reports that some time  in February, he picked up a heavy TV and at that time did not recall any  pain or popping noises that this may be the etiology of the patient's  hernia issues.   REVIEW OF SYSTEMS:  As per the history of present illness.  Otherwise,  review of systems is noncontributory.   SOCIAL HISTORY:  No current alcohol or tobacco.  He is married.   FAMILY HISTORY:  Noncontributory.   PAST MEDICAL HISTORY:  1. Morbid obesity.  2. Diabetes, on metformin.  3. GERD.  4. Obstructive sleep apnea.  5. Chronic lower extremity edema with chronic stasis dermatitis.  6. Peripheral neuropathy.   PAST SURGICAL HISTORY:  Laparoscopic cholecystectomy and left  inguinal  hernia repair with mesh by Dr. Daphine Deutscher.   ALLERGIES:  1. DEPAKOTE.  2. PROZAC.  3. PAXIL.  4. KLONOPIN.  5. ZYPREXA.  6. TOPAMAX.   PHYSICAL EXAMINATION:  GENERAL:  A pleasant male patient, up in the  chair, complaining of lower abdominal wall and umbilical pain.  VITAL SIGNS:  Temperature 96.5, BP 132/83, pulse 82 and regular, and  respirations 20.  PSYCH:  The patient is alert and oriented x3.  His affect is appropriate  to current situation.  NEURO:  Cranial nerves II through XII are grossly intact.  The patient  is moving all extremities x4.  No focal deficits.  HEENT:  Eyes, sclerae noninjected.  The patient wears glasses.  Ears,  nose, and throat:  Ears are symmetrical.  No otorrhea.  Nose is midline.  No rhinorrhea.  Oral mucous membranes are pink and moist.  CHEST:  Bilateral lung sounds are clear to auscultation, but diminished  in the bases bilaterally.  Respiratory effort is nonlabored.  CARDIOVASCULAR:  Heart sounds S1  and S2, but distant.  No rubs, murmurs,  or gallops.  No JVD.  ABDOMEN:  Obese and soft.  There is a very large pannus.  The umbilical  area has a small hernia, but there is also an area to the right of the  umbilicus extending from the umbilicus towards the lateral aspect of the  abdomen that is firms, is irregular shaped, and estimated size of about  6 x 4 cm.  This area is tender.  He also has a band-like area of  cellulitic redness extending from the level of the umbilicus down to the  symphysis pubis and this extends out bilaterally towards the sides of  the abdominal wall.  The estimated width between the umbilicus and the  symphysis pubis is about 8 cm.  EXTREMITIES:  Symmetrical appearance with chronic lower extremity edema  with hemosiderin and brawny changes.   LABORATORY DATA:  White count 6700, hemoglobin 14.2, platelets of  212,000, and neutrophils 78%.  Sodium 139, potassium 4.2, CO2 of 29,  glucose 158, BUN 11, and creatinine  0.93.   DIAGNOSTICS:  CT again reveals incarcerated fat with stranding at the  umbilicus, no bowel.   IMPRESSION:  Incarcerated umbilical hernia with omental fat.   PLAN:  1. Discussed with Dr. Janee Morn.  He will see the patient later today.      Plan on operative intervention in the morning, may eat today and      empty after midnight.  2. Lovenox tonight for DVT prophylaxis, no Lovenox tomorrow morning.  3. Continue Zosyn.  4. Other recommendations for treatment per Internal Medicine.      Allison L. Rennis Harding, N.P.      Gabrielle Dare Janee Morn, M.D.  Electronically Signed    ALE/MEDQ  D:  03/02/2009  T:  03/03/2009  Job:  [id number]   cc:   Minerva Areola L. August Saucer, M.D.

## 2011-05-15 NOTE — H&P (Signed)
NAMEDELLA, HOMAN               ACCOUNT NO.:  [id number]   MEDICAL RECORD NO.:  [id number]          PATIENT TYPE:  INP   LOCATION:  4713                         FACILITY:  [facility]   PHYSICIAN:  Eric L. August Saucer, M.D.     DATE OF BIRTH:  September 14, 1955   DATE OF ADMISSION:  03/01/2009  DATE OF DISCHARGE:                              HISTORY & PHYSICAL   CHIEF COMPLAINT:  Increasing lower abdominal pain with enlarging  abdominal mass.   HISTORY OF PRESENT ILLNESS:  One of several [facility]  admissions for this 56 year old married white male with long standing  history of morbid obesity, diabetes mellitus, sleep apnea, hypertension,  manic depressive disorder.  The patient relates he was doing well until  approximately 1 week ago when he noted the onset of a fullness in his  abdomen.  He states he had had a cold at that time and had been coughing  intermittently.  He had not done any recent strenuous activity.  He  states that the area was size of a melon when he first noticed this.  He watched this for a couple of days and then came to the office for  further evaluation as this area was not decreasing as well as increasing  abdominal pain.  When the patient was evaluated there was a concern for  possible hernia.  An initial plain x-ray did not show evidence of a  bowel entrapment.  He was also noted to have some redness to the lower  abdomen as well.  Today, his abdominal pain had increased.  He had had  persistent redness of his periumbilical region.  The patient was  subsequently admitted at this time for further evaluation and therapy.   PAST MEDICAL HISTORY:  Notable for a distant history of abdominal  surgery in the past with a recurrent hernia and mesh placement.  Family  relates this was done by Dr. Maryagnes Amos.  Past medical history otherwise  notable for long standing seizure disorder as noted.  He has sleep  apnea, hypertension, diabetes, morbid obesity.  The patient has had  an  excision of a right liposarcoma of the lower leg at [facility]  greater than 1 year ago.  He had recently had responsive dermatitis for  which he was placed on Aristocort cream per Dermatology.   Past history otherwise significant for sleep apnea, which he is  presently compliant with using his CPAP machine on a consistent basis.   ALLERGIES:  The patient is allergic to DEPAKOTE which cause  hallucination, PROZAC, PAXIL, KLONOPIN which cause rash, ZYPREXA,  TOPAMAX which cause hallucinations.   PRESENT MEDICATIONS:  1. Omeprazole 20 mg p.o. daily.  2. Potassium 20 mEq b.i.d.  3. Felodipine 10 mg daily.  4. Doxazosin 100  mg b.i.d.  5. Lisinopril 40 mg p.o. daily.  6. Metformin 500 mg daily.  7. Budeprion SR 150 mg daily.  8. Gabapentin 300 mg t.i.d.  9. Lecithin 1200 mg b.i.d.  10.Omega-3 fish oil 1000 mg b.i.d.  11.Vitamin C 1000 mg b.i.d.  12.Vitamin E capsule 1 daily.  13.Vitamin  B 1 tablet daily.  14.Lysine 1000 mg.  15.Hydrochlorothiazide 1 in a.m. and 3 tablets in p.m.  16.Chromium picolinate 1 tablet b.i.d.  17.Zinc 50 mg daily.  18.One a Day multivitamins 1 daily.   PHYSICAL EXAMINATION:  GENERAL:  He is a well-developed, morbidly obese  white male, in no acute distress.  VITAL SIGNS:  Height 5 feet 10 inches, weight 144.5 kg.  Temperature  99.3, blood pressure 140/88, pulse of 98,  respiratory rate of 20, O2  sats 96% on room air.  HEENT:  Head normocephalic, atraumatic without bruits.  Extraocular  muscles are intact.  No sinus tenderness.  TMs with decreased light  reflex bilaterally.  Throat, posterior pharynx is clear.  NECK:  Supple.  No enlarged thyroid.  No posterior cervical nodes.  LUNGS:  Clear, but with diminished breath sounds at bases.  No E to A  changes.  No CVA tenderness.  CARDIOVASCULAR:  Normal S1 and S2.  No S3.  No  ectopy.  ABDOMEN:  Obese and lower abdomen just below the umbilicus has a 8 cm in  diameter fullness, with now  increased warmth.  It feels to be well  loculated.  There are no bowel sounds appreciated in this region.  He  does have bowel sounds in the abdomen, otherwise.  This area with  tenderness extends into the left lower quadrant region, less so in the  right lower quadrant.  No rebound tenderness appreciated.  MUSCULOSKELETAL:  Full range of motion in upper extremities.  He does  have trace pitting edema now in the legs bilaterally.  Negative Homans.  Pulse is 1+ bilaterally.  NEUROLOGIC:  He is alert, oriented x3.  The cranial nerves were intact.  Nonfocal.   LABORATORY DATA:  CBC revealed WBC of 7800, hemoglobin of 14.2,  hematocrit 41.1, 72 polys, 60% lymphs.  Coagulation panel; INR was 1.0,  prothrombin time of 13.3.  Glucose is 189 initially, on repeat 102.  Sodium 139, potassium 4.2, chloride 103, CO2 of 29, BUN of 11,  creatinine of 0.93, and glucose of 158.  SGOT 20, SGPT 27, total protein  is 6.2, albumin of 3.3, calcium 8.8.   EKG; normal sinus rhythm, normal axis.  No acute changes appreciated.   IMPRESSION:  1. Increasing abdominal pain with abdominal fullness.  This are      notably was not reducible.  We need to exclude early hernia versus      abscess versus other.  2. Status post periumbilical hernia repair in the distant past with a      mesh placement.  3. Diabetes mellitus.  4. Sleep apnea.  5. Morbid obesity.  6. History of manic depression.   PLAN:  The patient admitted this time for further evaluation.  Will  undergo CT scan of the abdomen.  Surgical consultation will be obtained  if this proven to be a hernia.  We will continue his other home  medicines at this time.  He will be placed on IV Zosyn at this time to  cover for infection.  Tight  blood sugar controlled as tolerated.           ______________________________  Lind Guest. August Saucer, M.D.     ELD/MEDQ  D:  03/01/2009  T:  03/02/2009  Job:  [id number]

## 2011-05-18 NOTE — Assessment & Plan Note (Signed)
[facility]   NAME:  Tyler, Mccall               ACCOUNT NO.:  [id number]   MEDICAL RECORD NO.:  [id number]      DATE OF BIRTH:  1955-07-14   PHYSICIAN:  Theresia Majors. Tanda Rockers, M.D.      VISIT DATE:                                     OFFICE VISIT   SUBJECTIVE:  [name] is a 56 year old man who was seen a week ago with  bilateral stasis ulcers treated with compression wraps and Bactroban.  In  the interim he denies excessive swelling, malodor or drainage.   OBJECTIVE:  VITAL SIGNS: Blood pressure 140/75, respiratory rate 22, pulse  rate 80 and he is afebrile.  LOWER EXTREMITIES:  Persistence of 2+ edema and chronic changes of stasis.  There are multiple spotted areas of full thickness eschar which are easily  mechanically debrided.  There is scant yellowish drainage associated with  each of these punctate ulcerations that are located on the lateral and  medial aspects of the lower extremities.  There is no evidence of ascending  infection or cellulitis.   ASSESSMENT:  Improved stasis ulceration with adequate compression using  wraps.   PLAN:  We have replaced the patient's Una boots after applying a thick  coating of triamcinolone ointment.  We anticipate that his ulcers will be  100% resolved on his next visit and he will make a transition to bilateral  below the knee 30-40 mm compression hose.  We will re-evaluate the patient  in one week.           ______________________________  Theresia Majors. Tanda Rockers, M.D.     [name]  D:  08/22/2006  T:  08/23/2006  Job:  [id number]

## 2011-05-18 NOTE — Discharge Summary (Signed)
NAME:  [name], [name]               ACCOUNT NO.:  [id number]   MEDICAL RECORD NO.:  [id number]          PATIENT TYPE:  INP   LOCATION:  4706                         FACILITY:  [facility]   PHYSICIAN:  Eric L. August Saucer, M.D.     DATE OF BIRTH:  Dec 14, 1955   DATE OF ADMISSION:  04/20/2006  DATE OF DISCHARGE:  04/27/2006                                 DISCHARGE SUMMARY   Audio too short to transcribe (less than 5 seconds)           ______________________________  Lind Guest. August Saucer, M.D.     ELD/MEDQ  D:  05/23/2006  T:  05/23/2006  Job:  [id number]

## 2011-05-18 NOTE — Discharge Summary (Signed)
NAME:  [name], [name]               ACCOUNT NO.:  [id number]   MEDICAL RECORD NO.:  [id number]          PATIENT TYPE:  INP   LOCATION:  4706                         FACILITY:  [facility]   PHYSICIAN:  Eric L. August Saucer, M.D.     DATE OF BIRTH:  [birth year]   DATE OF ADMISSION:  04/20/2006  DATE OF DISCHARGE:  04/27/2006                                 DISCHARGE SUMMARY   FINAL DIAGNOSES:  1.  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  2.  Cellulitis of the leg, 682.6.  3.  Hypertension, 401.9.  4.  Morbid obesity, 278.00.  5.  Bipolar affective disorder, 296.7.   OPERATION AND PROCEDURES:  None.   HISTORY OF PRESENT ILLNESS:  Recent [place] hospital admission for this  56 year old married white male who presented to the hospital complaining of  increasing swelling in the right leg.  He has had intermittent swelling in  the leg with associated cellulitis for the past 5-6 months.  Recently, he  has had increasing symptoms associated with him having increased activity on  his legs as well.  Patient was presented to the emergency room for  evaluation and subsequently admitted by Dr. Algie Coffer.   PAST MEDICAL HISTORY AND PHYSICAL EXAM:  Admission H&P by Dr. Algie Coffer.   HOSPITAL COURSE:  Patient was admitted for further treatment of recurrent  cellulitis of both legs, though right being greater than left.  He has a  history of hypertension, obesity, and bipolar disorder as well.  Patient was  placed initially at bedrest with elevation of the legs.  He was started on  IV Rocephin.  He was also placed on Lovenox subcu.  Over the subsequent  days, he made some slow but gradual improvement.  He was noted to have a  persistent __________ tenderness in the right leg.  Previous Doppler exam  had been negative for DVT.  With gradual resolution of the swelling of his  legs, he was noted to have persistent fullness in the right gastrocnemius  muscle.  Patient being significantly overweight has  extremely large legs.  Further evaluation of this area was pursued.  CT scan of the right leg show  a soft tissue tumor.  Subsequently, MRI scan was arranged.  This  demonstrated changes in the muscle itself.  Most suggestive of a  liposarcoma.  He was seen in consultation by [facility].  Because of the questionable involvement of the muscle as well as vessels and  surrounding nerve, it was recommended patient be evaluated at [facility] by a Dr. Romana Juniper.  Arrangements were subsequently made for  patient to be seen there at the time of discharge.   Notably with IV antibiotics and elevation his associated redness did  improve.  He did become more ambulatory.  His blood pressure and  psychotropic medications were maximized and patient remained stable  otherwise.  By 04/28, he was subsequently stable for discharge with further  outpatient followup as outlined.   MEDICATIONS:  Medication at the time of discharge consists of:  1.  Xanax 0.25 mg  p.o. t.i.d.  2.  Darvocet-N 100 one p.o. q. 4-6 hours p.r.n.  3.  Wellbutrin XL 150 mg p.o. q. a.m.  4.  Neurontin 300 mg three times a day.  5.  KCl 10 mEq p.o. b.i.d.  6.  Protonix 40 mg p.o. q. a.m.   Patient will be seen in our office in one week's time for followup.  He has  been advised to maintained a 4 g sodium, low-salt diet, as he was noted to  have mild hypoglycemia as well.  This will be followed up on as an  outpatient.  His A1c was 6.2 in the hospital.           ______________________________  Lind Guest. August Saucer, M.D.     ELD/MEDQ  D:  07/03/2006  T:  07/04/2006  Job:  [id number]

## 2011-05-18 NOTE — Discharge Summary (Signed)
[name], [name]               ACCOUNT NO.:  [id number]   MEDICAL RECORD NO.:  [id number]          PATIENT TYPE:  INP   LOCATION:  4713                         FACILITY:  [facility]   PHYSICIAN:  Eric L. August Saucer, M.D.     DATE OF BIRTH:  Jul 26, 1955   DATE OF ADMISSION:  03/01/2009  DATE OF DISCHARGE:  03/04/2009                               DISCHARGE SUMMARY   FINAL DIAGNOSES:  1. Umbilical hernia with obstruction, 552.1.  2. 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  3. Morbid obesity 278.01.  4. Diabetes mellitus type 2, 50.00.  5. Esophageal reflux, 530.81.  6. Obstructive sleep apnea, 327.23.  7. Venous insufficiency 459.81.   OPERATIONS AND PROCEDURES:  Umbilical hernia repair per Dr. Violeta Gelinas.   HISTORY OF PRESENT ILLNESS:  This is one of several [facility]  admissions for this 56 year old married white male with a long-standing  history of morbid obesity, diabetes mellitus, sleep apnea, hypertension,  and manic depressive disorder.  The patient relates he had been doing  well until approximately 1 week ago when he noted the onset of a  fullness in his abdomen.  He states he had had a cold at that time and  had been coughing intermittently.  He had not done any recent strenuous  activity.  He states that the area was the size of a melon when he first  noticed this.  He watched his abdomen over the subsequent days and came  to the office for further evaluation as the area had not decrease in  size.  He was also experiencing increasing abdominal pain.  The patient  did undergo further evaluation.  Initial plain x-ray did not show  evidence of bowel entrapment.  He was noted to have some redness in the  lower abdomen.  On the day of admission, he had noted increasing  abdominal pain with persistent redness in the periumbilical area.  The  patient subsequently admitted for further evaluation and therapy.   PAST MEDICAL HISTORY AND PHYSICAL EXAMINATION:  As per admission  H&P.   HOSPITAL COURSE:  The patient was admitted for further evaluation of  increasing abdominal pain.  In view of evidence for hernia, there is  question of a possible incarceration versus other.  He was initially  placed at bowel rest.  He started on IV fluids as well.  In view of  associated redness with possible early cellulitis, he was placed on IV  Zosyn.  He was continued on sliding scale insulin for blood sugar  control.   The patient initially underwent ultrasound of the abdomen which did not  reveal evidence for an abscess, but question of a possible umbilical  hernia.  A CT scan was recommended for further evaluation.  This was  subsequently done and demonstrated an area of fat that was incarcerated  into the umbilical area with associated stranding of the tissue.  The  patient was seen in consultation by Dr. Janee Morn.  It was felt that the  patient need urgent surgery and subsequently proceeded as such.   The patient tolerated the procedure well without  incidence.  The partial  necrotic omentum was removed.   The subsequent day, the patient felt considerably better.  He gradually  regained bowel activity.  His diet was slowly advanced which he  tolerated well.  By March 04, 2009, the patient was feeling considerably  better.  He was passing flatus.  He was ambulatory.  He was felt by  surgery to be stable for discharge with close posthospitalization  followup.   The patient was discharged home as noted on March 04, 2009.   Medications at the time of discharge consisted of;  1. Omeprazole 20 mg daily.  2. Potassium chloride 20 mEq b.i.d.  3. Felodipine 10 mg daily.  4. Doxycycline 100 mg b.i.d.  5. Lisinopril 40 mg daily.  6. Metformin 500 mg daily.  7. Budeprion SR 150 mg b.i.d.  8. Gabapentin 300 mg t.i.d.  9. Lecithin 1200 mg daily.  10.Omega-3 fish oil 1000 mg twice a day.  11.Vitamin C 1000 mg daily.  12.Vitamin B1 one tablet daily.  13.Lysine 1000 mg  daily.  14.Chromium picolinate 1 tablet twice a day.  15.Zinc 50 mg twice a day.  16.The patient will have Percocet 5/325 one to two tablets q.4 h.      p.r.n. pain as well.   He has advanced his diet as tolerated.  He will follow up with Dr.  Janee Morn in 2 weeks' time.  He will be seen in my office in 3 weeks'  time.           ______________________________  Lind Guest August Saucer, M.D.     ELD/MEDQ  D:  04/20/2009  T:  04/21/2009  Job:  [id number]

## 2011-05-18 NOTE — Op Note (Signed)
NAME:  [name], [name]               ACCOUNT NO.:  [id number]   MEDICAL RECORD NO.:  [id number]          PATIENT TYPE:  OIB   LOCATION:  2550                         FACILITY:  [facility]   PHYSICIAN:  Angelia Mould. Derrell Lolling, M.D.DATE OF BIRTH:  19-Jul-1955   DATE OF PROCEDURE:  01/08/2007  DATE OF DISCHARGE:                               OPERATIVE REPORT   PREOPERATIVE DIAGNOSES:  1. Gallstones.  2. Incarcerated umbilical hernia.   POSTOPERATIVE DIAGNOSES:  1. Gallstones.  2. Incarcerated umbilical hernia.   OPERATION PERFORMED:  1. Laparoscopic cholecystectomy with intraoperative cholangiogram.  2. Repair incarcerated umbilical hernia.   SURGEON:  Angelia Mould. Derrell Lolling, M.D.   FIRST ASSISTANT:  Dr. Karie Soda   OPERATIVE INDICATIONS:  This is a 56 year old white man with morbid  obesity, seizure disorder, bipolar disorder, sleep apnea, diabetes,  hypertension, gastroesophageal reflux disease.  He was recently found to  have gallstones during admission following a seizure with mild elevation  of his liver function tests which have since normalized.  He does not  have any significant history compatible with biliary colic.  He does  have moderately large incarcerated umbilical hernia which bothers him.  Because of his diabetes and morbid obesity.  He is felt to be at high  risk for major complications of his biliary tract disease.  I offered  him the option of having his gallbladder removed and his umbilical  hernia repaired at the same time and he wants to do that.   OPERATIVE FINDINGS:  The gallbladder was chronically inflamed,  discolored but was not thick-walled.  There were very few adhesions to  the gallbladder.  The gallbladder contained numerous stones of varying  sizes.  The anatomy of the cystic duct, cystic artery and common bile  duct were conventional.  The cholangiogram was normal showing borderline  dilated intrahepatic and extrahepatic ducts, no filling defect, and no  obstruction with good flow of contrast into the duodenum.  The umbilical  hernia had a defect approximately 2.5 cm in diameter and contained a  great deal of incarcerated omentum which required a laparoscopic lysis  of adhesions to reduce.   OPERATIVE TECHNIQUE:  Following induction of general endotracheal  anesthesia the patient was identified as to correct procedure, correct  patient.  Intravenous antibiotics were given prior to the incision.  The  abdomen was prepped and draped in sterile fashion.  0.5% Marcaine with  epinephrine was used as local infiltration anesthetic.   A 5 mm OptiVu port was placed in the right upper quadrant under direct  vision.  This insertion was atraumatic.  Pneumoperitoneum was created.  Visualization was carried out with findings as described above.  A  second 5 mm trocar was placed in the right upper quadrant.  A 10-mm  trocar was placed in subxiphoid region and 11 mm port was placed just to  the right of midline midway from the xiphoid to the umbilicus.   We took down all the adhesions of the umbilical hernia.  This required  scissor dissection to get all of the omental adhesions lysed and the  omentum was reduced  back into the abdominal cavity.  There was no  bleeding.   We then positioned the patient.  The gallbladder fundus was elevated.  We retracted the omentum inferiorly.  We retracted the infundibulum of  the gallbladder and dissected out the cystic duct and cystic artery.  A  cholangiogram catheter was inserted into the cystic duct.  A  cholangiogram was obtained using the C-arm.  This showed normal biliary  anatomy, no obstruction and no filling defect.  The cholangiogram  catheter was removed.  The cystic duct was secured with multiple metal  clips and divided.  Cystic artery was isolated and secured with multiple  metal clips and divided.  We actually isolated the anterior branch and  the posterior branch separately and divided these  separately.  We then  dissected gallbladder away from its bed, placed the gallbladder in a  specimen bag and removed it through the midepigastric port.  This  required opening the skin little bit and opening the fascia a little  bit.  We spilled a little bit of bile into the wound but really had no  contamination of the abdominal cavity.  We irrigated the subhepatic  space and subphrenic space.  We had to use a little bit of cautery to  get hemostasis in the bed of the gallbladder.  It looked clean.  We  placed a piece of Surgicel gauze anyway.  After about 10 minutes this  was inspected and there was no active bleeding.   We closed the fascia in the midepigastric port site with three  interrupted sutures of #1 Novofil.  After a final survey of the abdomen,  we removed all the ports and the camera and the pneumoperitoneum.   I was made a curved transverse incision below the umbilicus.  Dissection  was carried down into subcutaneous tissue.  The very large umbilical  hernia sac was debrided all the way back to the edge of the fascia.  We  undermined the subcutaneous tissue around the edge of the fascia  completely.  The fascia felt quite strong and secure everywhere.  We  closed the hernia primarily.  I felt this would be reasonable.  I did  not want to put any prosthetic mesh in because of the bile contamination  of the upper abdomen and wound.  A #1 Novofil was used.  Simple sutures  were placed the corner and two #1 Novofil sutures were placed in a vest  over pants fashion.  After all the sutures were placed, they were then  pulled up and the fascia overlapped quite nicely and all sutures were  tied.  The wounds were irrigated with saline.  Subcutaneous tissue at  the umbilicus was closed with interrupted sutures of 3-0 Vicryl.  All  skin incisions were closed with subcuticular sutures of 4-0 Monocryl and Steri-Strips.  Clean bandages were placed and the patient taken to  recovery  room in stable condition.  Estimated blood loss was about 25  mL.  Complications none.  Sponge, needle and instrument counts were  correct.      Angelia Mould. Derrell Lolling, M.D.  Electronically Signed     HMI/MEDQ  D:  01/08/2007  T:  01/08/2007  Job:  [id number]   cc:   Minerva Areola L. August Saucer, M.D.

## 2011-05-18 NOTE — Discharge Summary (Signed)
NAME:  [name], [name]               ACCOUNT NO.:  [id number]   MEDICAL RECORD NO.:  [id number]          PATIENT TYPE:  INP   LOCATION:  1405                         FACILITY:  [facility]   PHYSICIAN:  Eric L. August Saucer, M.D.     DATE OF BIRTH:  October 07, 1955   DATE OF ADMISSION:  09/01/2006  DATE OF DISCHARGE:  09/05/2006                               DISCHARGE SUMMARY   FINAL DIAGNOSES:  1. Hypokalemia, 276.8.  2. Seizure disorder, 780.39.  3. Nausea with vomiting, 787.01.  4. Morbid obesity, 278.01.  5. Sleep apnea, 780.57.  6. Hypertension, 401.9.  7. Manic depressive disorder, 296.80.  8. Abdominal pain, epigastric, 789.06.  9. Dehydration, 276.51.  10.Diabetes mellitus type 2, 250.00.   OPERATIONS AND PROCEDURES:  None.   HISTORY OF PRESENT ILLNESS:  This was one of several [facility]  admissions for this 56 year old white male who presented to the  emergency department with a 2-week history of nausea and vomiting.  On  the day of admission, he also had a new onset of a seizure as well.  He  had been experiencing intermittent epigastric pain as well as right  upper quadrant pain.  There was no fever, chills or night sweats.  The  patient was evaluated by Dr. Algie Coffer.  He was subsequently admitted to  the hospital for further evaluation.  Electrolytes at the time of  admission demonstrated a sodium of 129, potassium 1.6, chloride of 72,  CO2 38, BUN of 23 and creatinine 2.0.   PAST MEDICAL HISTORY:  Per admission H&P.   PHYSICAL EXAM:  Per admission H&P.   HOSPITAL COURSE:  The patient was admitted for further treatment of  severe hypokalemia.  He was also noted to have a new seizure, onset  which was felt to be possibly metabolic.  He underwent several runs of  potassium to replace his low potassium.  He was also rehydrated as well.  Over the subsequent days, he began to slowly feel better.  Followup of  potassium after 1 day of intensive treatment still showed a level  at  2.1.  He had notably been on Zaroxolyn at that time as well.  He was  continued on 3 additional runs of potassium.  His IV fluids were  adjusted as well.   Over the subsequent days, his potassium did slowly increase.  It was  evidence for a significant deficit that the patient had developed.   While hospitalized, further evaluation of his original right upper  quadrant pain was pursued.  An abdominal ultrasound did show some  layering of stones in his gallbladder.  The patient was seen in  consultation thereafter by Dr. Claud Kelp of Surgery.  It was felt  that he did have indeed gallstones, but currently was asymptomatic.  He  recommended that the patient undergo elective procedure at a later date.   The patient was gradually re-mobilized.  He did have an exacerbation of  his bipolar illness after being off his Neurontin for several days; this  was subsequently restarted with gradual improvement.   The patient also had  issues regarding his diabetes.  He initially had  some denial.  Much education was spent regarding dietary compliance and  the need for medication.  By September 05, 2006, he was felt to be stable  and able to be discharged home.   DISCHARGE MEDICATIONS:  1. K-Dur 40 mEq t.i.d. for 3 days, then 40 mEq twice a day.  2. Metformin 500 mg daily.  3. Neurontin 300 mg t.i.d.  4. Wellbutrin 150 mg daily.   DIET:  The patient will be maintained on a no-concentrated-sweets, low-  fat diet.   FOLLOWUP:  He will be seen in the office in 2 weeks' time for followup  and readjustment of his other medications as well.           ______________________________  Lind Guest. August Saucer, M.D.     ELD/MEDQ  D:  02/13/2007  T:  02/13/2007  Job:  [id number]

## 2011-05-18 NOTE — Discharge Summary (Signed)
NAME:  [name], [name]               ACCOUNT NO.:  [id number]   MEDICAL RECORD NO.:  [id number]          PATIENT TYPE:  INP   LOCATION:  3742                         FACILITY:  [facility]   PHYSICIAN:  Eric L. August Saucer, M.D.     DATE OF BIRTH:  [birth year]   DATE OF ADMISSION:  05/26/2009  DATE OF DISCHARGE:  06/01/2009                               DISCHARGE SUMMARY   FINAL DIAGNOSES:  1. Acute bronchitis, 466.0.  2. Alkalosis, 276.13.  3. Hypokalemia, 276.8  4. 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 Sleep apnea, 780.57.  6. Morbid obesity, 278.01.  7. Manic depressive disorder, 296.80.  8. Dermatitis, 692.9.  9. Diabetes mellitus type 2, 250.00.  10.Osteoarthrosis, 715.90.  11.Long-term use of medication, 3458.69.   OPERATIONS AND PROCEDURES:  None.   HISTORY OF PRESENT ILLNESS:  This is one of several [facility]  admissions for this 56 year old married black male with longstanding  history of sleep apnea, morbid obesity, bipolar disorder with early  diabetes mellitus.  The patient states he had been having increasing  problems with cough and congestion intermittently for the past month.  Over the past few weeks prior to admission, he had had increasing  nonproductive cough with increasing lower extremity edema as well.  He  was evaluated recently and noted be mildly hyperkalemic, Lasix 20 mg  daily.  The patient was placed on extra potassium at 40 mEq b.i.d.  The  patient had not been experiencing any diarrhea, nausea, or vomiting.  Over the past week, however, he noted increasing weakness and fatigue.  He also experienced increasing nasal congestion and postnasal drainage.  The patient subsequently came to the emergency room because of  increasing cough and congestion.  At the time of evaluation, potassium  level was found be less than 2.  The patient was subsequently admitted  for further evaluation and therapy.   PAST MEDICAL HISTORY AND PHYSICAL EXAM:  As per admission H  and P.   HOSPITAL COURSE:  The patient was admitted for further evaluation of  acute bronchitis and severe hypokalemia with potassium being less than  2.  The patient was placed on telemetry at that time.  He was started on  IV potassium replacement as well as oral.  Hormonal levels including  cortisol and aldosterone levels were obtained as well.  He was noted  over the first 24-48 hours to be very slow to correct.  He was given  runs of potassium as well as p.o. potassium which he tolerated.  He was  maintained on his sliding scale insulin regimen as well.  He was placed  on empiric antibiotics for his associated bronchitis.  After the first  48 hours, serum potassium was still low at 2.6.  A urine potassium and  lytes were not consistent with a hormonal source of his hypokalemia.  Aldosterone level as well as PRA was ordered and was still pending over  the subsequent days.  The patient was continued on oral as well as IV  potassium.  By May 30, 2009, he did achieve a level of 3.7.  Notably,  his bronchitis symptoms was improving with IV antibiotics and nebulizer  therapy as well.  His EKG did not demonstrate significant arrhythmia  changes as a result of his hypokalemia.   Because it was unclear as to the cause of his hypokalemia, the patient  was seen by the Renal Team as well.  They were suspicious for either a  hypoaldosteronism.  Could not exclude an unusual adverse effect from the  patient's Lasix or metolazone.  He, however, was continually monitored.  The patient continued to make steady progress thereafter with  stabilization of his potassium.   Eventually, his bronchitis symptoms did resolve as well.  The patient  was made more ambulatory, which he tolerated well.  His serum  aldosterone levels did return as being normal.  There was concern that  the patient may have again had an excessive response to an associated  diuretic.   By May 31, 2009, he was feeling much better.   His bronchitis was  markedly improved.  His sleep apnea was stable with the use of a CPAP  machine as well.  Blood sugars were remaining under good control.  It  was felt that he could be managed further as an outpatient as well.   The patient was subsequent discharged home on June 01, 2009, much  improved.   Medications at the time of discharge consisted of:  1. Felodipine 5 mg daily.  2. Prilosec 20 mg daily.  3. Wellbutrin 150 mg daily.  4. Neurontin 300 mg t.i.d.  5. Lecithin 1200 mg b.i.d.  6. Melatonin 1 tablet daily.  7. K-Dur 20 mEq b.i.d.  8. Aldactone 50 mg b.i.d.  9. Wellbutrin 150 mg b.i.d.  10.Lantus 10 units subcu nightly.  11.He may continue taking his magnesium 1 tablet daily.  12.Selenium sulfide 200 mcg daily.  13.Doxycycline 100 mg b.i.d.  14.Fish oil 1 capsule daily.   The patient will be seen in the office for routine followup in 3 weeks'  time.     ______________________________  Lind Guest August Saucer, M.D.    ______________________________  Lind Guest. August Saucer, M.D.    ELD/MEDQ  D:  08/10/2009  T:  08/11/2009  Job:  [id number]

## 2011-05-18 NOTE — Consult Note (Signed)
NAME:  [name], [name] NO.:  [id number]   MEDICAL RECORD NO.:  [id number]          PATIENT TYPE:  INP   LOCATION:  1405                         FACILITY:  [facility]   PHYSICIAN:  Angelia Mould. Derrell Lolling, M.D.DATE OF BIRTH:  [birth year]   DATE OF CONSULTATION:  09/04/2006  DATE OF DISCHARGE:                                   CONSULTATION   REASON FOR CONSULTATION:  Evaluate gallstones and abdominal pain.   HISTORY OF PRESENT ILLNESS:  This is a 56 year old white man with multiple  medical problems.  He reports a 2-week history of nausea and vomiting, and  then 2 days prior to admission had epigastric pain but not before.  He  denies any prior similar episodes.  Denies diarrhea, constipation or change  in his bowel movements or blood in his stool.  He had a new first ever  seizure on September 2 was brought to the hospital and was found to be  profoundly hypokalemic with a potassium 1.6, sodium 129, creatinine 2.0, BUN  23.  Hemoglobin of 17.9 and white blood cell count 13,900.  Dr. August Saucer  admitted him and put him in the intensive care unit.  The abdominal pain has  resolved.  An ultrasound shows several small layering gallstones in the  gallbladder, but there was no evidence of any inflammatory change and common  bile duct was normal.  Initially, he had a mild elevation of his SGOT and  SGPT, but these promptly normalized.  Amylase and lipase have been normal.  He has resumed diet and has had no recurrence of his abdominal pain or  nausea.  He is about ready to go home, but I was asked to render an opinion  about his gallbladder.   PAST HISTORY:  1. Bipolar disorder.  2. Sleep apnea.  3. 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  4. Borderline diabetes.  5. Morbid obesity.  He has lost from 425-330 in the last year or two.  6. He has leg ulcers.  7. 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 on May 31, 2006.  8. His history of hypertension.   CURRENT MEDICATIONS:  1. Prilosec 20 mg a day.  2. Floppiness 5 mg a day.  3. Lasix 80 mg a day.  4. Doxycycline 100 mg b.i.d.  5. Wellbutrin 150 mg XL daily.  6. Neurontin 3 mg  t.i.d.  7. Lecithin 1200 mg b.i.d.  8. Calcium.  9. Magnesium.  10.Selenium.  11.Lysine.  12.Vitamin C.  13.Potassium   ALLERGIES:  DEPAKOTE, ZYPREXA,TOPAMAX, KLONOPIN, PROZAC, PAXIL.   SOCIAL HISTORY:  Married for 11 years.  No children, four miscarriages.  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   FAMILY HISTORY:  Mother is deceased, age 58, unknown.  Father deceased age  64, cancer of the lung, asbestos exposure, three brothers, one sister.   REVIEW OF SYSTEMS:  A 15 system review of systems are performed and  noncontributory, except as above.   PHYSICAL EXAMINATION:  GENERAL  APPEARANCE:  Pleasant, obese gentleman,  sitting up in chair, enjoying his meal, in no distress.  His wife is in the  room with him.  VITAL SIGNS:  He has been afebrile.  His temperature is 97.1, respirations  18, heart rate 90 and regular, blood pressure 156/79.  HEENT:  Eyes:  Sclerae clear.  Extraocular movements intact.  Ear, Nose,  Mouth and Throat:  Nose looks clean and oropharynx without gross lesions.  NECK:  Supple, nontender.  No mass.  No jugular distension.  LUNGS:  Clear to auscultation.  No chest wall tenderness.  HEART:  Regular rate and rhythm.  No murmur.  Radial and femoral pulses are  palpable.  ABDOMEN:  Obese but soft, nontender.  There is a large umbilical hernia  which is incarcerated, perhaps 4 cm in diameter.  No skin changes.  No  tenderness anywhere.  No mass anywhere.  Liver and spleen do not appear  enlarged.  EXTREMITIES:  Has some trace edema of the right lower extremities.  I did  not examine his surgical scars.  NEUROLOGIC:  No gross motor sensory deficits.   ASSESSMENT:  1. Gallstones.  His nausea, vomiting, abdominal pain  might have been a      severe attack of biliary colic but this is not clear.  2. Incarcerated umbilical hernia.  3. Sleep apnea.  4. Procedure disorder, new onset.  5. Borderline diabetes.  6. Morbid obesity.  7. Hypertension.  8. Bipolar disorder.   PLAN:  1. The patient is asymptomatic and may be discharged home and will follow-      up with me in the office in 2 or 3 weeks to discuss elective      consideration of umbilical hernia repair and cholecystectomy.   I have discussed the indication and details of laparoscopic cholecystectomy  as well as umbilical herniorrhaphy with the patient and his wife in general  and will discuss this in more detail when I see them in the office.  They  seem interested in considering having this done at some point but are not  interested in having a done immediately.   I think it would be wise to wait a little while until his electrolyte  disturbance has been stable for a couple of weeks anyway.      Angelia Mould. Derrell Lolling, M.D.  Electronically Signed     HMI/MEDQ  D:  09/04/2006  T:  09/05/2006  Job:  [id number]

## 2011-05-18 NOTE — Consult Note (Signed)
NAME:  [name], [name]               ACCOUNT NO.:  [id number]   MEDICAL RECORD NO.:  [id number]          PATIENT TYPE:  INP   LOCATION:  4703                         FACILITY:  [facility]   PHYSICIAN:  Thornton Park. Daphine Deutscher, MD  DATE OF BIRTH:  1955/12/19   DATE OF CONSULTATION:  04/25/2006  DATE OF DISCHARGE:                                   CONSULTATION   CONSULTING SURGEON:  Dr. Daphine Deutscher   CARDIOLOGIST:  Dr. 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   HISTORY OF PRESENT ILLNESS:  [name] is a 56 year old male patient, prior  history of hypertension, obstructive sleep apnea, and chronic lower  extremity edema.  He was admitted on April 20, 2006, due to increasing right  lower extremity weakness and peripheral edema.  The patient reports that he  has experienced right lower extremity swelling for about 6 months.  He would  have pain, usually only with walking.  Several weeks before admission he  walked more than he usually does and developed severe pain and numbness in  the right lower extremity.  On the date of admission he had again severe  pain and the leg had gotten so weak he was unable to bear weight.  He was  sent to the ER for evaluation.  CT of the right lower extremity demonstrated  a solid mass.  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  He has also experienced intermittent sharp pain  in the calf and intermittent numbness of the right knee.   REVIEW OF SYSTEMS:  As per the history of present illness.  He has had  problems for several years with peripheral edema.  He has lost 7 pounds  since admission.  He has had problems with lesions and questionable skin  rashes and the skin of the right lower extremity.  This onset began with the  developed of the right lower extremity edema.  He has been treated as an  outpatient with oral antibiotics, topical antibiotics, and topical steroid  agents without success.  He has not had any fevers or chills or  constitutional symptoms.  He has had no GI symptoms such as nausea,  vomiting, or abdominal pain.  He has not had any cardiac symptoms such as  shortness of breath, dyspnea on exertion, or chest pain.  He notices that if  he walks significant amount of distance he will have pain in the right lower  extremity which is very severe.   SOCIAL HISTORY:  He is a former polysubstance abuser.  The patient admits to  prior marijuana, amphetamine, alcohol, heroin, and cigarette use.  He has  been clean since 1979 to 1980s.  He is married.  His wife is a Landscape architect.   FAMILY MEDICAL HISTORY:  Positive for asthma and schizophrenia.   PAST MEDICAL HISTORY:  1.  Bipolar disorder.  2.  Hypertension.  3.  Obstructive sleep apnea on nocturnal BiPAP.  4.  Morbid obesity.  5.  Chronic lower extremity edema.   PAST SURGICAL HISTORY:  None.  ALLERGIES:  1.  TOPAMAX which caused severe peripheral edema.  2.  VARIOUS PSYCHIATRIC MEDICATIONS which cause hallucinations and other      undesirable side effects.   CURRENT MEDICATIONS:  Protonix, Lasix, Wellbutrin, Neurontin, vitamin C,  potassium, Darvocet, Lovenox for DVT prophylaxis, Rocephin IV, Ambien,  Tylenol.   PHYSICAL EXAMINATION:  GENERAL:  Pleasant male complaining of intermittent  pain in the right lower extremity with associated edema for at least 6  months.  VITAL SIGNS:  Temperature 97.6, BP 145/90, pulse 80, respirations 20.  NEUROLOGIC:  The patient is alert and oriented.  He is moving all  extremities x4.  Ambulation and gait were not checked.  HEENT:  Head is normocephalic, sclerae not injected.  NECK:  Supple without adenopathy.  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  CHEST:  Bilateral lung sounds are clear to auscultation.  He is saturating  97% on room air.  He uses BiPAP nocturnally.  HEART:  S1, S2.   No rubs, murmurs, thrills.  No gallops, no JVD.  Carotids  2+ bilaterally.  No appreciable bruits.  ABDOMEN:  Obese, soft, nontender, nondistended, without hepatosplenomegaly,  masses, or bruits noted.  EXTREMITIES:  The right lower extremity shows a soft enlargement of the calf  which is nontender distally, more tender in the superior aspect.  Unable to  appreciate a definite discrete mass.  In this general area on the anterior  tibia surface there are also areas of a papular dry lesions.  The left lower  extremity is unremarkable.  Pulses are intact peripherally radial, femoral,  and pedal, and sensation is intact including in the right lower extremity.   LABORATORY DATA:  ESR 14.  Hemoglobin A1c 6.2.  Sodium 140, potassium 3.9,  BUN 14, creatinine 1.1.  LFTs are normal.  HDL is suboptimal at 31.  White  count is 7000; hemoglobin 14.2; platelets 229,000.   DIAGNOSTICS:  A 2-D echocardiogram has been done this admission and shows  essentially normal LV function, EF 55%, no right heart enlargement.  MRI of  the right lower extremity shows heterogenous mass of the right medical calf  suspicious for liposarcoma.  This mass abuts the neurovascular bundle and  there is questionable extension along the neurovascular bundle.   IMPRESSION:  1.  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  2.  Obstructive sleep apnea without right heart enlargement on recent      echocardiogram.  3.  Hypertension, controlled.  4.  Bipolar disorder, stable.   PLAN:  Dr. Daphine Deutscher has seen, interviewed, and examined the patient.  He has  also reviewed the MRI results.  At this time we would like to review the MRI  and the case with other physicians, i.e. orthopedics and/or vascular.  If  any biopsy is done, plan to do it as a combined procedure with biopsy and excision.  This is again pending Dr. Ermalene Searing discussion with  other  physicians as noted.      Allison L. Gwyneth Sprout Daphine Deutscher, MD  Electronically Signed    ALE/MEDQ  D:  04/25/2006  T:  04/26/2006  Job:  [id number]

## 2011-05-18 NOTE — Consult Note (Signed)
NAME:  [name], [name]               ACCOUNT NO.:  [id number]   MEDICAL RECORD NO.:  [id number]          PATIENT TYPE:  REC   LOCATION:  FOOT                         FACILITY:  [facility]   PHYSICIAN:  Theresia Majors. Tanda Rockers, M.D.DATE OF BIRTH:  02-18-1955   DATE OF CONSULTATION:  03/31/2007  DATE OF DISCHARGE:                                 CONSULTATION   SUBJECTIVE:  Reason for the follow-up consultation:  [name] is a 56-  year-old man who we have discharged in August 2007 after resolution of  stasis ulcerations related to postoperative changes.  In the interim, he  has worn bilateral 30-40 mm compression hose.  He recently had an  episode of swelling and warmth and underwent a duplex scan which turned  out to be negative.  He has had no draining ulcerations.  No malodor.  No pain in no fever.   OBJECTIVE:  The patient is accompanied by his wife.  His blood pressure  is 154/79, respirations of 20, pulse rate 83, temperature is 97.8.  WOUND:  There are no wounds in the lower extremities.  There are  bilateral changes of stasis.  There are several areas of punctate dry  eschars, most likely related to minor trauma or scratching in the past.  The dorsalis pedis pulse is readily palpable bilaterally.  The  protective sensation is preserved.   ASSESSMENT:  Chronic venous insufficiency with stasis.   RECOMMENDATIONS:  The patient has been encouraged to continue the use of  the bilateral below-the-knee compression hose 30-40 mmHg gradient.  We  have given the patient and his wife an opportunity to ask questions.  They seem to understand the explanations of the management of chronic  venous insufficiency.  We have encouraged the patient to continue his  followup with Dr. August Saucer.  He will call Wound Center or Dr. Diamantina Providence office  if he develops recurrence of ulcerations or becomes concerned with his  progress at any time.      Harold A. Tanda Rockers, M.D.  Electronically Signed     HAN/MEDQ  D:   03/31/2007  T:  03/31/2007  Job:  [id number]   cc:   Minerva Areola L. August Saucer, M.D.

## 2011-05-18 NOTE — Assessment & Plan Note (Signed)
[facility]   NAME:  [name], [name]               ACCOUNT NO.:  [id number]   MEDICAL RECORD NO.:  [id number]      DATE OF BIRTH:  1955/08/03   PHYSICIAN:  Theresia Majors. Tanda Rockers, M.D. VISIT DATE:  08/29/2006                                     OFFICE VISIT   SUBJECTIVE:  Mr. Beals is a 56 year old man who was evaluated for  postoperative stasis ulcers in his lower extremities.  And then ________  bandage with external compression wraps (una boots). During the interim he  reports that he has had less swelling, less pain and is able to ambulate  with greater ease.   OBJECTIVE:  His blood pressure is 160/80, respirations 20, pulse rate 72 and  he is afebrile. Inspection of the lower extremity shows that the wounds are  completely resolved.   IMPRESSION:  Improved.   PLAN:  He will be prescribed bilateral below the knee 30 to 40 mm  compression hose. We are discharging the patient from the clinic with the  advice that if these ulcers should reoccur within 30 days he is to call the  clinic for an immediate appointment.  After 30 days he will be reevaluated  by his primary care physician and referred back to the clinic if needed. The  patient seems to understand, expressed his gratitude for having been seen in  the clinic.           ______________________________  Theresia Majors. Tanda Rockers, M.D.     [name]  D:  08/29/2006  T:  08/29/2006  Job:  [id number]   cc:   Minerva Areola L. August Saucer, M.D.

## 2011-05-18 NOTE — Assessment & Plan Note (Signed)
[facility]   NAME:  [name], [name]               ACCOUNT NO.:  [id number]   MEDICAL RECORD NO.:  [id number]      DATE OF BIRTH:  [birth year]   PHYSICIAN:  Maxwell Caul, M.D. VISIT DATE:  08/16/2006                                     OFFICE VISIT   VITAL SIGNS:  Temperature 97.5, pulse 80, respirations 22, blood pressure  130/80.   PURPOSE OF TODAY'S VISIT:  Review of wound, bilateral lower extremity.   Primary doctor is Dr. Willey Blade.   HISTORY:  [name] is here with his wife. He tells me that he has had  problems with lower extremities over the last two or three years. His wife  has been treating these topically, and the wound seem to have come and gone  over time. 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 The surgical procedure seems to  have prompted a flare of these wounds which currently are being treated with  topical bacitracin, Dakin's and being cleansed by his wife. Also, Dr. August Saucer  has been aggressively treating him with Lasix and Zaroxolyn. The patient  tells me his edema in his legs has much improved.   PAST MEDICAL HISTORY:  By description, it sounds as though he has sleep  apnea, gastroesophageal reflux disease, history of depression, insomnia.   MEDICATION LIST:  Which is extensive has been reviewed, and I am referring  the reader this note to Dr. Diamantina Providence consult note.   PHYSICAL EXAMINATION:  GENERAL:  The patient is no distress.  RESPIRATORY:  Air entry is clear.  CARDIAC:  Heart sounds somewhat distant. There is no murmur. No increase in  jugular venous pressure at this point.  CIRCULATION:  Peripheral pulses are all well felt, even in his feet. There  are booming dorsalis pedis pulses.   WOUND EXAM:  Over the heel, the surgical site on the posterior aspect of his  right leg are 2 wounds which are attempting to have minor epithelization.  They are dry. There is no  drainage. In a more puzzling sense in the anterior  thighs of both of his legs are patchy degrees of erythema with small punched  wounds. The ones on the right have some degree of necrotic drainage. The  erythema itself is not particularly warm, although it superficially looks  like cellulitis, and I gather he has been treated as such (currently on  doxycycline). One would have to wonder if this has some alternative  explanation. The numerous small wounds are on his right anterior leg and to  a lesser extent on his left anterior leg as well. I see no other evidence of  any skin wounds at any other place. The legs themselves have 2+ to 3+ edema  to his knees. There is clearly an element of venous stasis as well.   WOUND SINCE LAST VISIT:   CHANGE IN INTERVAL MEDICAL HISTORY:   DIAGNOSIS:  1. Venous stasis wounds. These clearly could have a component of venous      stasis.  2. Wounds along his surgical incision in the posterior aspect of his right      leg. He tells me that his oncologist in  [place] did not feel that      the sarcoma did not have anything to do with the skin changes. Have to      wonder.  3. Patchy erythema on the anterior legs bilaterally with small punched out      wounds. These have the appearance of venous stasis. I have seen similar      such wounds in people on dialysis. However, he is not on dialysis. One      would have to wonder about a vasculitis-type appearance as well      although I note he has a very low sedimentation rate in the mid 20s.   TREATMENT:   ANESTHETIC USED:   TISSUE DEBRIDED:   LEVEL:   CHANGE IN MEDS:   COMPRESSION BANDAGE:   OTHER:   MANAGEMENT PLAN & GOAL:  I am going to go ahead and treat these simply as if  this was a venous stasis ulceration. We have applied Bactroban to all of  these wounds. They will be covered with a mesh and wrapped in Profore  dressing. We will see how this does over the next two weeks or so. No  matter  what the etiology of the wound is, removal of edema sometimes causes  resolution. If the wounds on his anterior legs associated with the islands  of erythema do not heal, then he may require a skin biopsy. However, for  now, I would like to go at this in a simplistic fashion. I note all of the  lab work provided by Dr. August Saucer including a normal CBC, normal comprehensive  metabolic panel with sed rate of 27 and a hemoglobin A1c of only 6.2. His  BNP is 13.   His legs were treated as outlined above. We will see again in one week.      Maxwell Caul, M.D.  Electronically Signed     MGR/MEDQ  D:  08/16/2006  T:  08/16/2006  Job:  [id number]

## 2011-05-30 ENCOUNTER — Encounter (HOSPITAL_COMMUNITY): Payer: 59 | Admitting: Physician Assistant

## 2011-05-30 DIAGNOSIS — F319 Bipolar disorder, unspecified: Secondary | ICD-10-CM

## 2011-10-16 ENCOUNTER — Encounter (HOSPITAL_COMMUNITY): Payer: Self-pay | Admitting: *Deleted

## 2011-10-18 ENCOUNTER — Encounter (HOSPITAL_COMMUNITY): Payer: Self-pay | Admitting: Physician Assistant

## 2011-10-23 ENCOUNTER — Encounter (HOSPITAL_COMMUNITY): Payer: MEDICARE | Admitting: Physician Assistant

## 2011-10-23 DIAGNOSIS — F319 Bipolar disorder, unspecified: Secondary | ICD-10-CM

## 2011-11-14 ENCOUNTER — Other Ambulatory Visit (HOSPITAL_COMMUNITY): Payer: Self-pay | Admitting: Physician Assistant

## 2011-11-14 MED ORDER — ZIPRASIDONE HCL 40 MG PO CAPS: ORAL_CAPSULE | ORAL | Status: DC

## 2012-04-22 ENCOUNTER — Ambulatory Visit (INDEPENDENT_AMBULATORY_CARE_PROVIDER_SITE_OTHER): Payer: MEDICARE | Admitting: Physician Assistant

## 2012-04-22 DIAGNOSIS — F319 Bipolar disorder, unspecified: Secondary | ICD-10-CM | POA: Insufficient documentation

## 2012-04-22 NOTE — Progress Notes (Signed)
   [facility] Follow-up Outpatient Visit  [name] June 29, 1955  Date: 04/22/2012   Subjective: [name] presents today to followup on his medications prescribed for bipolar disorder. He reports that he has been depressed for the last 2 weeks, but he is beginning to overcome that. His wife states that he had been sick for several days, and she feels that played into his decompensation of mood. He states that he is sleeping well, and his appetite is fine. His wife reports that the Geodon "knocks him out," and he sleeps approximately 12 hours per night. [name] denies that this is a problem, and does not want to change the dose. He denies any suicidal or homicidal ideation. He denies any auditory or visual hallucinations.  There were no vitals filed for this visit.  Mental Status Examination  Appearance: Well groomed and casually dressed Alert: Yes Attention: good  Cooperative: Yes Eye Contact: Good Speech: Clear and even, with decreased volume Psychomotor Activity: Decreased Memory/Concentration: Intact Oriented: person, place, time/date and situation Mood: Depressed Affect: Blunt Thought Processes and Associations: Logical Fund of Knowledge: Fair Thought Content: Normal Insight: Fair Judgement: Good  Diagnosis: Bipolar disorder, NOS  Treatment Plan: We will continue his Geodon at 40 mg daily, and Lamictal 100 mg daily. He will followup in 6 months.  Mayson Sterbenz, PA-C

## 2012-08-12 ENCOUNTER — Other Ambulatory Visit (HOSPITAL_COMMUNITY): Payer: Self-pay | Admitting: *Deleted

## 2012-08-12 MED ORDER — LAMOTRIGINE 100 MG PO TABS: 100.0000 mg | ORAL_TABLET | Freq: Every day | ORAL | Status: DC

## 2012-08-12 NOTE — Telephone Encounter (Signed)
Authorized per Jorje Guild, PA

## 2012-09-23 DIAGNOSIS — K219 Gastro-esophageal reflux disease without esophagitis: Secondary | ICD-10-CM | POA: Insufficient documentation

## 2012-09-23 DIAGNOSIS — I10 Essential (primary) hypertension: Secondary | ICD-10-CM | POA: Insufficient documentation

## 2012-10-23 ENCOUNTER — Ambulatory Visit (INDEPENDENT_AMBULATORY_CARE_PROVIDER_SITE_OTHER): Payer: No Typology Code available for payment source | Admitting: Physician Assistant

## 2012-10-23 DIAGNOSIS — F319 Bipolar disorder, unspecified: Secondary | ICD-10-CM

## 2012-10-23 DIAGNOSIS — F3130 Bipolar disorder, current episode depressed, mild or moderate severity, unspecified: Secondary | ICD-10-CM

## 2012-10-23 MED ORDER — BUPROPION HCL ER (SR) 150 MG PO TB12: 150.0000 mg | ORAL_TABLET | Freq: Every day | ORAL | Status: DC

## 2012-10-23 NOTE — Progress Notes (Signed)
   [facility] Follow-up Outpatient Visit  [name] August 10, 1955  Date: 10/23/2012   Subjective: [name] presents today with his wife to followup on his treatment for bipolar disorder. He endorses increased irritability and anger, which she takes out on his wife. He also endorses sadness. He denies any suicidal or homicidal ideation. He denies any auditory or visual hallucinations. He reports that he sleeps too much, and his wife as that he will sleep from 8:30 PM till 10:30 AM. His appetite is poor, and his wife reports that she has to force him to a period he states that his energy is low, he has been isolating, and he has decreased interest.  There were no vitals filed for this visit.  Mental Status Examination  Appearance: Fairly groomed and casually dressed Alert: Yes Attention: good  Cooperative: Yes Eye Contact: Good Speech: Clear and coherent Psychomotor Activity: Decreased and Psychomotor Retardation Memory/Concentration: Intact Oriented: person, place, time/date and situation Mood: Depressed Affect: Congruent Thought Processes and Associations: Logical Fund of Knowledge: Good Thought Content: Normal Insight: Fair Judgement: Fair  Diagnosis: Bipolar disorder, currently depressed  Treatment Plan: We will add Wellbutrin SR 150 mg to be taken once daily each morning. We will continue his Geodon 40 mg each evening, and Lamictal 100 mg daily. He has been instructed to increase his exercise, improve his eating habits, decrease his isolation, and decrease his excessive sleep. He will return for followup in 2 months.  Mariane Burpee, PA-C

## 2012-11-10 ENCOUNTER — Other Ambulatory Visit (HOSPITAL_COMMUNITY): Payer: Self-pay | Admitting: *Deleted

## 2012-11-10 DIAGNOSIS — F319 Bipolar disorder, unspecified: Secondary | ICD-10-CM

## 2012-11-10 MED ORDER — LAMOTRIGINE 100 MG PO TABS: 100.0000 mg | ORAL_TABLET | Freq: Every day | ORAL | Status: DC

## 2012-11-24 ENCOUNTER — Other Ambulatory Visit (HOSPITAL_COMMUNITY): Payer: Self-pay | Admitting: Psychiatry

## 2012-11-24 ENCOUNTER — Telehealth (HOSPITAL_COMMUNITY): Payer: Self-pay | Admitting: *Deleted

## 2012-11-24 NOTE — Progress Notes (Signed)
Call returned and left a message 

## 2012-11-24 NOTE — Telephone Encounter (Signed)
Reaction to Wellbutrin.Nervous tremors in his wrist. Unable to hold things in his hand. Requests call from provider

## 2012-12-01 ENCOUNTER — Telehealth (HOSPITAL_COMMUNITY): Payer: Self-pay | Admitting: *Deleted

## 2012-12-01 NOTE — Telephone Encounter (Signed)
12/2 pt left VM: Had a reaction to wellbutrin,tremors and shakes.Wanted Hessie Diener to know before next  appt end of this month.

## 2012-12-02 ENCOUNTER — Telehealth (HOSPITAL_COMMUNITY): Payer: Self-pay

## 2012-12-02 DIAGNOSIS — F319 Bipolar disorder, unspecified: Secondary | ICD-10-CM

## 2012-12-02 NOTE — Telephone Encounter (Signed)
Called pt to discuss side effects to Wellbutrin.  He remembers now having tremors from it in the past.  Will stop Wellbutrin and increase Lamictal to 150mg  daily

## 2012-12-08 ENCOUNTER — Other Ambulatory Visit (HOSPITAL_COMMUNITY): Payer: Self-pay | Admitting: Physician Assistant

## 2012-12-08 MED ORDER — ZIPRASIDONE HCL 40 MG PO CAPS: ORAL_CAPSULE | ORAL | Status: DC

## 2012-12-29 ENCOUNTER — Ambulatory Visit (INDEPENDENT_AMBULATORY_CARE_PROVIDER_SITE_OTHER): Payer: No Typology Code available for payment source | Admitting: Physician Assistant

## 2012-12-29 DIAGNOSIS — F319 Bipolar disorder, unspecified: Secondary | ICD-10-CM

## 2012-12-29 DIAGNOSIS — F3160 Bipolar disorder, current episode mixed, unspecified: Secondary | ICD-10-CM

## 2012-12-29 MED ORDER — ZIPRASIDONE HCL 60 MG PO CAPS: 60.0000 mg | ORAL_CAPSULE | Freq: Every day | ORAL | Status: DC

## 2012-12-29 MED ORDER — LAMOTRIGINE 200 MG PO TABS: 200.0000 mg | ORAL_TABLET | Freq: Every day | ORAL | Status: DC

## 2012-12-29 NOTE — Progress Notes (Signed)
   [facility] Follow-up Outpatient Visit  [name] 1955-12-09  Date: 12/29/2012   Subjective: Timohty presents today to followup on his treatment for bipolar disorder. He reports that he is "not feeling good." He states that he is trying to follow the directions that were given to him at his last appointment, and is walking 10-15 minutes per episode, and is trying to walk on a daily basis. He continues to experience a significant amount of irritability, and has some angry verbal outbursts at times. He feels the increased dose of low medical has been hardly, if at all, helpful. The Wellbutrin that was started at his last appointment caused him to develop hand tremors and an inability to grasp items, so that was discontinued. He reports that his sleep is fair, and his appetite has been poor because he had a breast or infection. He denies any suicidal or homicidal ideations. He denies any auditory or visual hallucinations.  There were no vitals filed for this visit.  Mental Status Examination  Appearance: Disheveled Alert: Yes Attention: good  Cooperative: Yes Eye Contact: Good Speech: Clear and coherent Psychomotor Activity: Normal Memory/Concentration: Intact Oriented: person, place, time/date and situation Mood: Dysphoric Affect: Blunt Thought Processes and Associations: Logical Fund of Knowledge: Fair Thought Content: Normal Insight: Fair Judgement: Fair  Diagnosis: Bipolar mixed  Treatment Plan: We will increase his Lamictal to 200 mg daily, and increase his Geodon to 60 mg at bedtime. He will return for followup in 2 months.  Namir Neto, PA-C

## 2013-03-03 ENCOUNTER — Ambulatory Visit (HOSPITAL_COMMUNITY): Payer: Self-pay | Admitting: Physician Assistant

## 2013-03-24 ENCOUNTER — Other Ambulatory Visit (HOSPITAL_COMMUNITY): Payer: Self-pay | Admitting: Physician Assistant

## 2013-04-23 ENCOUNTER — Other Ambulatory Visit (HOSPITAL_COMMUNITY): Payer: Self-pay | Admitting: Physician Assistant

## 2013-04-23 MED ORDER — LURASIDONE HCL 40 MG PO TABS: 40.0000 mg | ORAL_TABLET | Freq: Every day | ORAL | Status: DC

## 2013-05-04 ENCOUNTER — Ambulatory Visit (INDEPENDENT_AMBULATORY_CARE_PROVIDER_SITE_OTHER): Payer: 59 | Admitting: Physician Assistant

## 2013-05-04 VITALS — BP 118/72 | HR 82 | Ht 70.0 in | Wt 352.0 lb

## 2013-05-04 DIAGNOSIS — F3160 Bipolar disorder, current episode mixed, unspecified: Secondary | ICD-10-CM

## 2013-05-04 DIAGNOSIS — F319 Bipolar disorder, unspecified: Secondary | ICD-10-CM

## 2013-05-04 MED ORDER — LURASIDONE HCL 80 MG PO TABS: 80.0000 mg | ORAL_TABLET | Freq: Every day | ORAL | Status: DC

## 2013-05-04 MED ORDER — LURASIDONE HCL 40 MG PO TABS: 80.0000 mg | ORAL_TABLET | Freq: Every day | ORAL | Status: DC

## 2013-05-04 NOTE — Progress Notes (Signed)
[facility] [id number] Progress Note  [name] [id number] 58 y.o.  05/04/2013 2:50 PM  Chief Complaint: Depression and irritability  History of Present Illness: [name] presents today with his wife to followup on history that for bipolar disorder. He was with his wife at her appointment approximately one and one half weeks ago and this practitioner changed his Geodon to Jordan at that time. He reports that he cannot tell a difference as far as his mood goes.  He reports that he is sleeping less on what to do that he was on Geodon, but as it turns out, he was sleeping too much on Geodon. His wife states that he goes to sleep around 10:30 PM, and gets up around 9:00 AM on the Latuda. She also reports that she and others see that his mood has brightened somewhat.  [name] and his wife both report there is still a significant amount of irritability. The co-pay for this new medication is the same as it was for the Geodon. He denies any new side effects. He denies any suicidal or homicidal thoughts. He denies any auditory or visual hallucinations. He denies any paranoid delusions.  Suicidal Ideation: No Plan Formed: NA Patient has means to carry out plan: NA  Homicidal Ideation: No Plan Formed: NA Patient has means to carry out plan: NA  Review of Systems: Psychiatric: Agitation: Yes Hallucination: No Depressed Mood: Yes Insomnia: No Hypersomnia: Yes Altered Concentration: No Feels Worthless: Yes Grandiose Ideas: No Belief In Special Powers: No New/Increased Substance Abuse: No Compulsions: No  Neurologic: Headache: No Seizure: No Paresthesias: No  Past Medical Family, Social History: Married  Outpatient Encounter Prescriptions as of 05/04/2013  Medication Sig Dispense Refill  . lamoTRIgine (LAMICTAL) 200 MG tablet TAKE 1 TABLET BY MOUTH DAILY.  90 tablet  0  . lurasidone (LATUDA) 80 MG TABS Take 1 tablet (80 mg total) by mouth daily with breakfast.  30 tablet  1  .  ziprasidone (GEODON) 60 MG capsule TAKE 1 CAPSULE BY MOUTH AT BEDTIME WITH FOOD  90 capsule  0  . [DISCONTINUED] lurasidone (LATUDA) 40 MG TABS Take 1 tablet (40 mg total) by mouth daily with breakfast.  15 tablet  0  . [DISCONTINUED] lurasidone (LATUDA) 40 MG TABS Take 2 tablets (80 mg total) by mouth daily with breakfast.  30 tablet  1   No facility-administered encounter medications on file as of 05/04/2013.    Past Psychiatric History/Hospitalization(s): Anxiety: Yes Bipolar Disorder: Yes Depression: Yes Mania: Yes Psychosis: Yes Schizophrenia: No Personality Disorder: No Hospitalization for psychiatric illness: Yes History of Electroconvulsive Shock Therapy: No Prior Suicide Attempts: No  Physical Exam: Constitutional:  BP 118/72  Pulse 82  Ht 5\' 10"  (1.778 m)  Wt 352 lb (159.666 kg)  BMI 50.51 kg/m2  General Appearance: alert, oriented, no acute distress, obese and Casual  Musculoskeletal: Strength & Muscle Tone: within normal limits Gait & Station: normal Patient leans: N/A  Psychiatric: Speech (describe rate, volume, coherence, spontaneity, and abnormalities if any): Clear and coherent with normal rate and volume  Thought Process (describe rate, content, abstract reasoning, and computation): Within normal limits  Associations: Coherent  Thoughts: normal  Mental Status: Orientation: oriented to person, place, time/date and situation Mood & Affect: depressed affect and agitation Attention Span & Concentration: Intact  Medical Decision Making (Choose Three): Established Problem, Stable/Improving (1), Review and summation of old records (2) and Review of New Medication or Change in Dosage (2)  Assessment: Axis I: Bipolar disorder  mixed  Axis II: Deferred  Axis III: Sleep apnea, obesity  Axis IV: Moderate  Axis V: 55   Plan: We will increase his Latuda to 80 mg each morning after breakfast, and continue his Lamictal 200 mg daily. He will return for  followup in 2 months. He is encouraged to call between appointments if concerns arise.  Mikeila Burgen, PA-C 05/04/2013

## 2013-05-05 ENCOUNTER — Telehealth (HOSPITAL_COMMUNITY): Payer: Self-pay | Admitting: *Deleted

## 2013-05-05 ENCOUNTER — Other Ambulatory Visit (HOSPITAL_COMMUNITY): Payer: Self-pay | Admitting: Physician Assistant

## 2013-05-05 ENCOUNTER — Telehealth (HOSPITAL_COMMUNITY): Payer: Self-pay

## 2013-05-05 MED ORDER — ARIPIPRAZOLE 5 MG PO TABS: 5.0000 mg | ORAL_TABLET | Freq: Every day | ORAL | Status: DC

## 2013-05-05 NOTE — Telephone Encounter (Signed)
VM left at [id number] Recv'd VM [id number]--VM:Please Call.Needs Urgent Medication Change

## 2013-05-05 NOTE — Telephone Encounter (Signed)
Returned patient's call regarding needing an urgent medication change. Patient reports that the [name] will cost $80 per month, and that his unaffordable. Patient has never tried Abilify. Prescription for Abilify 5 mg daily sent to pharmacy.

## 2013-05-28 ENCOUNTER — Telehealth (HOSPITAL_COMMUNITY): Payer: Self-pay

## 2013-06-01 ENCOUNTER — Telehealth (HOSPITAL_COMMUNITY): Payer: Self-pay | Admitting: *Deleted

## 2013-06-01 NOTE — Telephone Encounter (Signed)
VM: When pt went to have Abilify refilled, it cost $78 and this is more than he can afford. Needs another medication substituted.He has two days left.

## 2013-06-01 NOTE — Telephone Encounter (Signed)
Advised Dr. Lucianne Muss of pt's question.Per Dr. Almon Hercules A.Watt's absence), no substitute for Abilify. If samples are available, offer them to pt. Contacted pt and he states samples would help.  Samples requested from [facility] pharmacy.

## 2013-06-02 ENCOUNTER — Other Ambulatory Visit (HOSPITAL_COMMUNITY): Payer: Self-pay | Admitting: Psychiatry

## 2013-06-03 NOTE — Telephone Encounter (Addendum)
Contacted pt 6/4 0910: Advised pt that MD [Dr.Kumar in A.Watt [place] absence] does not advise stopping Abilify at this time due to diagnosis - may cause symptoms to worsen. Should stay on it if at all possible. Advised pt that samples obtained of Abilify 10 mg (take 1/2 tablet daily) for patient and that office has discount cards for medication which will reduce copay to $25. Patient states he will pick up samples of Abilify 10 mg and take half (5 mg) and stay on them until he discusses medications with A.Watt at next appt. States discount card is not helpful because eventually it will stop working and he will be back to paying $78 or more for medication.

## 2013-06-04 ENCOUNTER — Telehealth (HOSPITAL_COMMUNITY): Payer: Self-pay

## 2013-06-04 NOTE — Telephone Encounter (Signed)
2:57pm 06/04/13 pt came and pick-up samples ABILIFY 10MG   Marguerite Olea

## 2013-06-24 ENCOUNTER — Other Ambulatory Visit (HOSPITAL_COMMUNITY): Payer: Self-pay | Admitting: Physician Assistant

## 2013-06-24 DIAGNOSIS — F319 Bipolar disorder, unspecified: Secondary | ICD-10-CM

## 2013-06-29 ENCOUNTER — Ambulatory Visit (HOSPITAL_COMMUNITY): Payer: Self-pay | Admitting: Physician Assistant

## 2013-07-06 ENCOUNTER — Ambulatory Visit (INDEPENDENT_AMBULATORY_CARE_PROVIDER_SITE_OTHER): Payer: 59 | Admitting: Physician Assistant

## 2013-07-06 VITALS — BP 137/81 | HR 82 | Ht 70.0 in | Wt 355.8 lb

## 2013-07-06 DIAGNOSIS — F319 Bipolar disorder, unspecified: Secondary | ICD-10-CM

## 2013-07-06 DIAGNOSIS — F3160 Bipolar disorder, current episode mixed, unspecified: Secondary | ICD-10-CM

## 2013-07-06 MED ORDER — QUETIAPINE FUMARATE 100 MG PO TABS: 100.0000 mg | ORAL_TABLET | Freq: Every day | ORAL | Status: DC

## 2013-07-09 NOTE — Progress Notes (Signed)
[facility] [id number] Progress Note  [name] [id number] 58 y.o.  07/06/2013 2:42 PM  Chief Complaint: Depression and anger  History of Present Illness: [name] presents today accompanied by his wife to followup on his treatment for bipolar disorder. At his last appointment we increased his dose of Latuda to 80 mg. After leaving the office he called back and reported he needed an urgent medication change. He was changed to Abilify, which he now reports has been helpful, but it is too expensive. He reports that when he is unstable he gets very angry and feels extremely depressed. His wife has done some research and they want to try Seroquel. We discussed side effects of Seroquel including the possibility of an increase in appetite and subsequent weight gain. He reports that he can control his eating habits. We discussed that if the Seroquel was not helpful or causes intolerable side effects, we will try Haldol, especially in the light that the cost of medication is an important factor.  Suicidal Ideation: No Plan Formed: No Patient has means to carry out plan: No  Homicidal Ideation: No Plan Formed: No Patient has means to carry out plan: No  Review of Systems: Psychiatric: Agitation: Yes Hallucination: No Depressed Mood: Yes Insomnia: No Hypersomnia: No Altered Concentration: No Feels Worthless: No Grandiose Ideas: No Belief In Special Powers: No New/Increased Substance Abuse: No Compulsions: No  Neurologic: Headache: No Seizure: No Paresthesias: No  Past Medical History: Obesity, obstructive sleep apnea  Outpatient Encounter Prescriptions as of 07/06/2013  Medication Sig Dispense Refill  . lamoTRIgine (LAMICTAL) 200 MG tablet TAKE 1 TABLET BY MOUTH DAILY.  90 tablet  0  . QUEtiapine (SEROQUEL) 100 MG tablet Take 1 tablet (100 mg total) by mouth at bedtime.  30 tablet  1  . [DISCONTINUED] ARIPiprazole (ABILIFY) 5 MG tablet Take 1 tablet (5 mg total) by mouth  daily.  30 tablet  1   No facility-administered encounter medications on file as of 07/06/2013.    Past Psychiatric History/Hospitalization(s): Anxiety: Yes Bipolar Disorder: Yes Depression: Yes Mania: Yes Psychosis: No Schizophrenia: No Personality Disorder: No Hospitalization for psychiatric illness: No History of Electroconvulsive Shock Therapy: No Prior Suicide Attempts: No  Physical Exam: Constitutional:  BP 137/81  Pulse 82  Ht 5\' 10"  (1.778 m)  Wt 355 lb 12.8 oz (161.39 kg)  BMI 51.05 kg/m2  General Appearance: alert, oriented, no acute distress, well nourished and fairly groomed and casually dressed  Musculoskeletal: Strength & Muscle Tone: within normal limits Gait & Station: normal Patient leans: N/A  Psychiatric: Speech (describe rate, volume, coherence, spontaneity, and abnormalities if any): Clear and coherent had a regular rate and rhythm and normal volume  Thought Process (describe rate, content, abstract reasoning, and computation): Within normal limits  Associations: Intact  Thoughts: normal  Mental Status: Orientation: oriented to person, place, time/date and situation Mood & Affect: Irritable and dysphoric Attention Span & Concentration: Intact  Medical Decision Making (Choose Three): Established Problem, Stable/Improving (1), Review of Psycho-Social Stressors (1) and Review of New Medication or Change in Dosage (2)  Assessment: Axis I: Bipolar disorder mixed  Axis II: Deferred  Axis III: Obesity, obstructive sleep apnea  Axis IV: Moderate  Axis V: 60   Plan: We will discontinue the Abilify do to the cost and start him on Seroquel 100 mg at bedtime, with permission to increase to 200 mg after 2 weeks if necessary. We will continue his Lamictal 200 mg daily. He will return for followup  in 2 months. He is encouraged to call between appointments if concerns arise. If the Seroquel is ineffective, or causes intolerable side effects, we will  start him on Haldol.   Satoria Dunlop, PA-C 07/09/2013

## 2013-09-07 ENCOUNTER — Ambulatory Visit (HOSPITAL_COMMUNITY): Payer: Self-pay | Admitting: Physician Assistant

## 2013-09-28 ENCOUNTER — Other Ambulatory Visit (HOSPITAL_COMMUNITY): Payer: Self-pay | Admitting: Physician Assistant

## 2013-09-28 DIAGNOSIS — F319 Bipolar disorder, unspecified: Secondary | ICD-10-CM

## 2013-10-05 ENCOUNTER — Other Ambulatory Visit (HOSPITAL_COMMUNITY): Payer: Self-pay | Admitting: Physician Assistant

## 2013-10-05 DIAGNOSIS — F319 Bipolar disorder, unspecified: Secondary | ICD-10-CM

## 2013-10-26 ENCOUNTER — Ambulatory Visit (HOSPITAL_COMMUNITY): Payer: Self-pay | Admitting: Physician Assistant

## 2013-11-09 ENCOUNTER — Other Ambulatory Visit (HOSPITAL_COMMUNITY): Payer: Self-pay | Admitting: Physician Assistant

## 2013-11-09 DIAGNOSIS — F319 Bipolar disorder, unspecified: Secondary | ICD-10-CM

## 2013-11-11 NOTE — Telephone Encounter (Signed)
Chart reviewed.Refill appropriate Appointment with Dr. Lolly Mustache 11/12/13

## 2013-11-12 ENCOUNTER — Ambulatory Visit (INDEPENDENT_AMBULATORY_CARE_PROVIDER_SITE_OTHER): Payer: 59 | Admitting: Psychiatry

## 2013-11-12 ENCOUNTER — Encounter (INDEPENDENT_AMBULATORY_CARE_PROVIDER_SITE_OTHER): Payer: Self-pay

## 2013-11-12 ENCOUNTER — Encounter (HOSPITAL_COMMUNITY): Payer: Self-pay | Admitting: Psychiatry

## 2013-11-12 VITALS — BP 137/72 | HR 98 | Ht 70.0 in | Wt 366.0 lb

## 2013-11-12 DIAGNOSIS — F319 Bipolar disorder, unspecified: Secondary | ICD-10-CM

## 2013-11-12 DIAGNOSIS — F3160 Bipolar disorder, current episode mixed, unspecified: Secondary | ICD-10-CM

## 2013-11-12 MED ORDER — QUETIAPINE FUMARATE 100 MG PO TABS: ORAL_TABLET | ORAL | Status: DC
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
## 2013-12-30 ENCOUNTER — Other Ambulatory Visit (HOSPITAL_COMMUNITY): Payer: Self-pay | Admitting: Psychiatry

## 2013-12-30 DIAGNOSIS — F319 Bipolar disorder, unspecified: Secondary | ICD-10-CM

## 2014-01-12 ENCOUNTER — Ambulatory Visit (HOSPITAL_COMMUNITY): Payer: Self-pay | Admitting: Psychiatry

## 2014-01-18 ENCOUNTER — Encounter (HOSPITAL_COMMUNITY): Payer: Self-pay | Admitting: Psychiatry

## 2014-01-18 ENCOUNTER — Ambulatory Visit (INDEPENDENT_AMBULATORY_CARE_PROVIDER_SITE_OTHER): Payer: Medicare HMO | Admitting: Psychiatry

## 2014-01-18 VITALS — BP 148/80 | HR 92 | Ht 70.0 in | Wt 367.2 lb

## 2014-01-18 DIAGNOSIS — F3160 Bipolar disorder, current episode mixed, unspecified: Secondary | ICD-10-CM

## 2014-01-18 DIAGNOSIS — F319 Bipolar disorder, unspecified: Secondary | ICD-10-CM

## 2014-01-18 MED ORDER — LAMOTRIGINE 150 MG PO TABS: ORAL_TABLET | ORAL | Status: DC

## 2014-01-18 MED ORDER — QUETIAPINE FUMARATE 100 MG PO TABS: ORAL_TABLET | ORAL | Status: DC
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
## 2014-01-18 NOTE — Progress Notes (Signed)
Reordered prescriptions for Lamictal and Seroquel to [facility] mail order pharmacy per Dr. Adele Schilder, 90 day supply.

## 2014-03-11 ENCOUNTER — Ambulatory Visit (INDEPENDENT_AMBULATORY_CARE_PROVIDER_SITE_OTHER): Payer: Medicare HMO | Admitting: Psychiatry

## 2014-03-11 ENCOUNTER — Encounter (HOSPITAL_COMMUNITY): Payer: Self-pay | Admitting: Psychiatry

## 2014-03-11 VITALS — BP 122/83 | HR 98 | Ht 70.0 in | Wt 359.4 lb

## 2014-03-11 DIAGNOSIS — F3160 Bipolar disorder, current episode mixed, unspecified: Secondary | ICD-10-CM

## 2014-03-11 DIAGNOSIS — F319 Bipolar disorder, unspecified: Secondary | ICD-10-CM

## 2014-03-11 MED ORDER — HYDROXYZINE HCL 10 MG PO TABS: 10.0000 mg | ORAL_TABLET | ORAL | Status: DC | PRN

## 2014-03-11 MED ORDER — QUETIAPINE FUMARATE 100 MG PO TABS: ORAL_TABLET | ORAL | Status: DC

## 2014-03-11 MED ORDER — LAMOTRIGINE 150 MG PO TABS: ORAL_TABLET | ORAL | Status: DC
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
## 2014-03-18 ENCOUNTER — Other Ambulatory Visit (HOSPITAL_COMMUNITY): Payer: Self-pay | Admitting: Psychiatry

## 2014-03-22 ENCOUNTER — Telehealth (HOSPITAL_COMMUNITY): Payer: Self-pay | Admitting: Psychiatry

## 2014-03-22 ENCOUNTER — Telehealth (HOSPITAL_COMMUNITY): Payer: Self-pay | Admitting: *Deleted

## 2014-03-22 ENCOUNTER — Other Ambulatory Visit (HOSPITAL_COMMUNITY): Payer: Self-pay | Admitting: Psychiatry

## 2014-03-22 DIAGNOSIS — F319 Bipolar disorder, unspecified: Secondary | ICD-10-CM

## 2014-03-22 MED ORDER — HYDROXYZINE HCL 10 MG PO TABS: 10.0000 mg | ORAL_TABLET | ORAL | Status: DC | PRN

## 2014-03-22 NOTE — Telephone Encounter (Signed)
Patient requesting 90 day prescription of Vistaril.  Given

## 2014-03-22 NOTE — Telephone Encounter (Signed)
Mother Santiago Glad called to state that Vistaril has been working and patient will need a refill in 15 days for a 90 day supply instead of 30 days to [facility]. She states Dr. Adele Schilder told her to call if the medication worked and he would give a 90 day supply. Dr. Adele Schilder will be notified for approval of early refill for 90 days.

## 2014-04-05 ENCOUNTER — Other Ambulatory Visit (HOSPITAL_COMMUNITY): Payer: Self-pay | Admitting: Psychiatry

## 2014-04-18 ENCOUNTER — Other Ambulatory Visit (HOSPITAL_COMMUNITY): Payer: Self-pay | Admitting: Psychiatry

## 2014-04-22 ENCOUNTER — Other Ambulatory Visit (HOSPITAL_COMMUNITY): Payer: Self-pay | Admitting: *Deleted

## 2014-04-22 DIAGNOSIS — F319 Bipolar disorder, unspecified: Secondary | ICD-10-CM

## 2014-04-22 MED ORDER — QUETIAPINE FUMARATE 100 MG PO TABS: ORAL_TABLET | ORAL | Status: DC

## 2014-04-22 MED ORDER — LAMOTRIGINE 150 MG PO TABS: ORAL_TABLET | ORAL | Status: DC

## 2014-04-22 NOTE — Telephone Encounter (Signed)
Pt called requesting 90 day refills for Seroquel and Lamictal. Contacted [facility]:Too soon.Will be placed on  File for pt.He can call for it. Advised him that per [facility], too soon to fill RX now-last filled 3/24.New RX can be placed on pt profile.He can call Customer service when he is 2 weeks from needing refill and they will fill it.Verbalized understanding.

## 2014-05-25 ENCOUNTER — Telehealth (HOSPITAL_COMMUNITY): Payer: Self-pay | Admitting: *Deleted

## 2014-05-25 DIAGNOSIS — F319 Bipolar disorder, unspecified: Secondary | ICD-10-CM

## 2014-05-25 MED ORDER — HYDROXYZINE HCL 10 MG PO TABS: ORAL_TABLET | ORAL | Status: DC

## 2014-05-25 NOTE — Telephone Encounter (Signed)
RX for Hydroxyzine refilled

## 2014-06-15 ENCOUNTER — Encounter (HOSPITAL_COMMUNITY): Payer: Self-pay | Admitting: Psychiatry

## 2014-06-15 ENCOUNTER — Ambulatory Visit (INDEPENDENT_AMBULATORY_CARE_PROVIDER_SITE_OTHER): Payer: Medicare HMO | Admitting: Psychiatry

## 2014-06-15 VITALS — BP 147/87 | HR 95 | Ht 70.0 in | Wt 370.2 lb

## 2014-06-15 DIAGNOSIS — F319 Bipolar disorder, unspecified: Secondary | ICD-10-CM

## 2014-06-15 DIAGNOSIS — F3160 Bipolar disorder, current episode mixed, unspecified: Secondary | ICD-10-CM

## 2014-06-15 MED ORDER — LAMOTRIGINE 150 MG PO TABS: ORAL_TABLET | ORAL | Status: DC

## 2014-06-15 MED ORDER — HYDROXYZINE HCL 10 MG PO TABS: 10.0000 mg | ORAL_TABLET | Freq: Every day | ORAL | Status: DC

## 2014-06-15 MED ORDER — QUETIAPINE FUMARATE 100 MG PO TABS: ORAL_TABLET | ORAL | Status: DC
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
## 2014-09-15 ENCOUNTER — Ambulatory Visit (HOSPITAL_COMMUNITY): Payer: Self-pay | Admitting: Psychiatry

## 2014-09-16 ENCOUNTER — Ambulatory Visit (HOSPITAL_COMMUNITY): Payer: Self-pay | Admitting: Psychiatry

## 2014-09-28 ENCOUNTER — Ambulatory Visit (HOSPITAL_COMMUNITY): Payer: Self-pay | Admitting: Psychiatry

## 2014-10-11 ENCOUNTER — Other Ambulatory Visit (HOSPITAL_COMMUNITY): Payer: Self-pay | Admitting: Psychiatry

## 2014-10-12 ENCOUNTER — Other Ambulatory Visit (HOSPITAL_COMMUNITY): Payer: Self-pay | Admitting: Psychiatry

## 2014-10-19 ENCOUNTER — Encounter (HOSPITAL_COMMUNITY): Payer: Self-pay | Admitting: Psychiatry

## 2014-10-19 ENCOUNTER — Ambulatory Visit (INDEPENDENT_AMBULATORY_CARE_PROVIDER_SITE_OTHER): Payer: Medicare HMO | Admitting: Psychiatry

## 2014-10-19 VITALS — Wt 380.0 lb

## 2014-10-19 DIAGNOSIS — F319 Bipolar disorder, unspecified: Secondary | ICD-10-CM

## 2014-10-19 MED ORDER — LAMOTRIGINE 150 MG PO TABS: ORAL_TABLET | ORAL | Status: DC

## 2014-10-19 MED ORDER — QUETIAPINE FUMARATE 50 MG PO TABS: 50.0000 mg | ORAL_TABLET | Freq: Every day | ORAL | Status: DC

## 2014-10-19 MED ORDER — HYDROXYZINE HCL 10 MG PO TABS: ORAL_TABLET | ORAL | Status: DC

## 2014-10-19 NOTE — Progress Notes (Signed)
[facility] [phone] Progress Note  CALEEL KINER [id number] 59 y.o.  07/06/2013 2:42 PM  Chief Complaint:  I am gaining a lot of weight.     History of Present Illness:  [name] came for his followup appointment.  He is concerned about his weight.  He admitted increased appetite.  He's taking Vistaril 10 mg as needed but he is compliant with his Seroquel and Lamictal.  Patient is also taking multiple medications for his health issues.  Last month he has visited his primary care physician Dr. Nat Math.  Patient told his blood pressure medication is changed but he do not remember the name.  He likes the Seroquel because that is helping his sleep and he has no recent agitation anger or any mood swings.  However he is concerned about weight gain because he has no energy and difficult to go around.  He is working 2 days at [employer] and he likes it.  He has no issues at his new place.  Patient denies any rash or itching.  He is also taking melatonin because it is helping his sleep.  He denies any paranoia, hallucination, violence or aggression.  He denies any suicidal thoughts or homicidal thoughts.  He lives with his wife who is also a patient in this office.  Patient does not drink or use any illegal substances.  Suicidal Ideation: No Plan Formed: No Patient has means to carry out plan: No  Homicidal Ideation: No Plan Formed: No Patient has means to carry out plan: No  Review of Systems  Constitutional: Positive for malaise/fatigue.       Weight gain  Skin: Negative for itching and rash.  Psychiatric/Behavioral: The patient is nervous/anxious.    Psychiatric: Agitation: No Hallucination: No Depressed Mood: No Insomnia: No Hypersomnia: No Altered Concentration: No Feels Worthless: No Grandiose Ideas: No Belief In Special Powers: No New/Increased Substance Abuse: No Compulsions: No  Neurologic: Headache: No Seizure: No Paresthesias: No  Past Medical History:   Obesity, obstructive sleep apnea, DM, GERD, history of bone cancer sarcoma, history of hernia repair.  His primary care physician is Dr. Ane Payment in [place].  Outpatient Encounter Prescriptions as of 10/19/2014  Medication Sig  . amLODipine (NORVASC) 10 MG tablet Take 10 mg by mouth daily.  Marland Kitchen dicyclomine (BENTYL) 20 MG tablet   . fexofenadine-pseudoephedrine (ALLEGRA-D 24) 180-240 MG per 24 hr tablet   . hydrOXYzine (ATARAX/VISTARIL) 10 MG tablet TAKE ONE TABLET BY MOUTH DAILY  . lamoTRIgine (LAMICTAL) 150 MG tablet TAKE 1 TABLET BY MOUTH TWICE DAILY.  . Melatonin 5 MG CAPS   . metFORMIN (GLUMETZA) 1000 MG (MOD) 24 hr tablet Take 1,000 mg by mouth daily with breakfast.  . omeprazole (PRILOSEC) 40 MG capsule Take 40 mg by mouth daily.  . QUEtiapine (SEROQUEL) 50 MG tablet Take 1 tablet (50 mg total) by mouth at bedtime.  Marland Kitchen spironolactone (ALDACTONE) 100 MG tablet Take 100 mg by mouth daily.  . Vitamins A & D 5000-400 UNITS CAPS   . [DISCONTINUED] hydrOXYzine (ATARAX/VISTARIL) 10 MG tablet TAKE ONE TABLET BY MOUTH DAILY  . [DISCONTINUED] lamoTRIgine (LAMICTAL) 150 MG tablet TAKE 1 TABLET BY MOUTH TWICE DAILY.  . [DISCONTINUED] QUEtiapine (SEROQUEL) 100 MG tablet TAKE 1 TABLET BY MOUTH AT BEDTIME    Past Psychiatric History/Hospitalization(s): Patient has psychiatric illness since 1998.  He has 2 psychiatric hospitalization.  He was admitted in [place] at [facility].  Patient denies any history of suicidal attempt but  admitted history of suicidal thinking, paranoia, hallucination, severe depression and aggression.  In the past he had tried to Depakote, Klonopin, Topamax, Zoloft, Wellbutrin, Neurontin, Zyprexa, Prozac, Paxil, Geodon, Risperdal, BuSpar, lithium with limited response. Anxiety: Yes Bipolar Disorder: Yes Depression: Yes Mania: Yes Psychosis: No Schizophrenia: No Personality Disorder: No Hospitalization for psychiatric illness: No History of  Electroconvulsive Shock Therapy: No Prior Suicide Attempts: No  Physical Exam: Constitutional:  Wt 380 lb (172.367 kg)  General Appearance: well nourished and obese  Musculoskeletal: Strength & Muscle Tone: within normal limits Gait & Station: normal Patient leans: N/A  Psychiatric: Speech (describe rate, volume, coherence, spontaneity, and abnormalities if any): Slow, coherent and spontaneous.      Thought Process (describe rate, content, abstract reasoning, and computation): Within normal limits  Associations: Intact  Thoughts: normal  Mental Status: Orientation: oriented to person, place, time/date and situation Mood & Affect: anxiety Attention Span & Concentration: Intact  Established Problem, Stable/Improving (1), Review of Psycho-Social Stressors (1), Review and summation of old records (2), New Problem, with no additional work-up planned (3), Review of Last Therapy Session (1), Review of Medication Regimen & Side Effects (2) and Review of New Medication or Change in Dosage (2)  Assessment: Axis I: Bipolar disorder mixed  Axis II: Deferred  Axis III: See medical history.    Axis IV: Moderate  Axis V: 60   Plan:  I will get records from his primary care physician including his last blood work.  Patient is concerned about his weight.  I will cut down his Seroquel 50 mg to see if any improvement in weight loss.  I also encouraged weight-loss program including watching his calories and doing other exercise.  Patient may need nutritional counseling if he is unable to lose weight.  Continue Lamictal 150 mg twice a day and hydroxyzine 10 mg as needed.  Recommended to call us back if he has any question or any concern.  I will see him again in 3 months. Time spent 25 minutes.  More than 50% of the time spent in psychoeducation, counseling and coordination of care.  Discuss safety plan that anytime having active suicidal thoughts or homicidal thoughts then patient need to call  911 or go to the local emergency room.   [name] T., MD 10/19/2014

## 2015-01-18 ENCOUNTER — Ambulatory Visit (HOSPITAL_COMMUNITY): Payer: Self-pay | Admitting: Psychiatry

## 2015-02-10 ENCOUNTER — Other Ambulatory Visit (HOSPITAL_COMMUNITY): Payer: Self-pay | Admitting: Psychiatry

## 2015-02-10 ENCOUNTER — Encounter (HOSPITAL_COMMUNITY): Payer: Self-pay | Admitting: Psychiatry

## 2015-02-10 ENCOUNTER — Ambulatory Visit (INDEPENDENT_AMBULATORY_CARE_PROVIDER_SITE_OTHER): Payer: Medicare HMO | Admitting: Psychiatry

## 2015-02-10 VITALS — BP 137/82 | HR 91 | Ht 70.0 in | Wt 393.0 lb

## 2015-02-10 DIAGNOSIS — F319 Bipolar disorder, unspecified: Secondary | ICD-10-CM

## 2015-02-10 DIAGNOSIS — F3160 Bipolar disorder, current episode mixed, unspecified: Secondary | ICD-10-CM

## 2015-02-10 MED ORDER — HYDROXYZINE HCL 10 MG PO TABS: ORAL_TABLET | ORAL | Status: DC

## 2015-02-10 MED ORDER — LAMOTRIGINE 150 MG PO TABS: ORAL_TABLET | ORAL | Status: DC

## 2015-02-10 MED ORDER — QUETIAPINE FUMARATE 50 MG PO TABS: 50.0000 mg | ORAL_TABLET | Freq: Every day | ORAL | Status: DC

## 2015-02-10 NOTE — Progress Notes (Signed)
[facility] [phone] Progress Note  [name] [id number] 60 y.o.  07/06/2013 2:42 PM  Chief Complaint:  I continue to gain weight.  I'm taking a lot of medication.       History of Present Illness:  [name] came for his followup appointment with his wife.  He is concerned about weight gain and he is not happy .  He is taking multiple medication .  He continues to have episodic agitation and anger but he likes his Vistaril which comment down.  He recently seen his primary care physician and he was not happy because now he is taking for medication for blood pressure .  He is unable to lose weight.  Recently he had cellulitis and ulcer on his leg and required antibiotic but he still has lot of pain.  He has difficulty walking sometimes.  He sleeping on the couch because he tried to sleep on bed and it causes leg pain.  Patient has multiple health issues.  He is taking multiple medication.  He like Seroquel because it is helping his irritability, anger and manic symptoms.  Recently he has blood work and his hemoglobin A1c is stable.  Patient denies any paranoia, hallucination, aggression or any suicidal thoughts.  He continues to work 2 days at [employer] and he likes it.  Sometime he gets overwhelmed but he is able to handle his mood and anger very well.  Patient denies drinking or using any illegal substances.  He has gained another 10 born in last 3 months.  Patient lives with his wife who is also a patient in this office.    Suicidal Ideation: No Plan Formed: No Patient has means to carry out plan: No  Homicidal Ideation: No Plan Formed: No Patient has means to carry out plan: No  Review of Systems  Constitutional: Positive for malaise/fatigue. Negative for weight loss.       Weight gain  Musculoskeletal:       Left leg pain  Skin: Negative for itching and rash.  Neurological: Negative for headaches.  Psychiatric/Behavioral: The patient is nervous/anxious.     Psychiatric: Agitation: No Hallucination: No Depressed Mood: No Insomnia: No Hypersomnia: No Altered Concentration: No Feels Worthless: No Grandiose Ideas: No Belief In Special Powers: No New/Increased Substance Abuse: No Compulsions: No  Neurologic: Headache: No Seizure: No Paresthesias: No  Past Medical History:  Obesity, obstructive sleep apnea, DM, GERD, history of bone cancer sarcoma, history of hernia repair.  His primary care physician is Dr. Ashok Norris in [place].  Outpatient Encounter Prescriptions as of 02/10/2015  Medication Sig  . amLODipine (NORVASC) 10 MG tablet Take 10 mg by mouth daily.  Marland Kitchen atorvastatin (LIPITOR) 20 MG tablet   . carvedilol (COREG) 3.125 MG tablet   . fexofenadine-pseudoephedrine (ALLEGRA-D 24) 180-240 MG per 24 hr tablet   . glipiZIDE (GLUCOTROL) 5 MG tablet   . hydrOXYzine (ATARAX/VISTARIL) 10 MG tablet TAKE ONE TABLET BY MOUTH DAILY  . lamoTRIgine (LAMICTAL) 150 MG tablet TAKE 1 TABLET BY MOUTH TWICE DAILY.  Marland Kitchen losartan (COZAAR) 100 MG tablet   . Melatonin 5 MG CAPS   . metFORMIN (GLUMETZA) 1000 MG (MOD) 24 hr tablet Take 1,000 mg by mouth daily with breakfast.  . mupirocin ointment (BACTROBAN) 2 %   . omeprazole (PRILOSEC) 40 MG capsule Take 40 mg by mouth daily.  . QUEtiapine (SEROQUEL) 50 MG tablet Take 1 tablet (50 mg total) by mouth at bedtime.  Marland Kitchen spironolactone (ALDACTONE) 100 MG tablet Take  100 mg by mouth daily.  Marland Kitchen triamcinolone cream (KENALOG) 0.1 %   . Vitamins A & D 5000-400 UNITS CAPS   . [DISCONTINUED] dicyclomine (BENTYL) 20 MG tablet   . [DISCONTINUED] hydrOXYzine (ATARAX/VISTARIL) 10 MG tablet TAKE ONE TABLET BY MOUTH DAILY  . [DISCONTINUED] lamoTRIgine (LAMICTAL) 150 MG tablet TAKE 1 TABLET BY MOUTH TWICE DAILY.  . [DISCONTINUED] QUEtiapine (SEROQUEL) 50 MG tablet Take 1 tablet (50 mg total) by mouth at bedtime.    Past Psychiatric History/Hospitalization(s): Patient has psychiatric illness since 41.  He  has 2 psychiatric hospitalization.  He was admitted in [place] at [facility].  Patient denies any history of suicidal attempt but admitted history of suicidal thinking, paranoia, hallucination, severe depression and aggression.  In the past he had tried to Depakote, Klonopin, Topamax, Zoloft, Wellbutrin, Neurontin, Zyprexa, Prozac, Paxil, Geodon, Risperdal, BuSpar, lithium with limited response. Anxiety: Yes Bipolar Disorder: Yes Depression: Yes Mania: Yes Psychosis: No Schizophrenia: No Personality Disorder: No Hospitalization for psychiatric illness: No History of Electroconvulsive Shock Therapy: No Prior Suicide Attempts: No  Physical Exam: Constitutional:  BP 137/82 mmHg  Pulse 91  Ht 5\' 10"  (1.778 m)  Wt 393 lb (178.264 kg)  BMI 56.39 kg/m2  General Appearance: well nourished and obese  Musculoskeletal: Strength & Muscle Tone: within normal limits Gait & Station: normal Patient leans: N/A  Mental status examination: Patient is casually dressed and fairly groomed.  He appears tired but cooperative.  He maintained fair eye contact.  His his speech is slow at times pressured.  His thought process slow but logical.  He denies any auditory or visual hallucination.  He denies any active or passive suicidal thoughts or homicidal thought.  He described his mood anxious and nervous and his affect is appropriate.  There were no delusions, paranoia or any obsessive thoughts.  He has no tremors or shakes.  His psychomotor activity is slow.  He has no tremors or shakes.  His cognition is fairly intact.  He is alert and oriented 3.  His insight judgment and impulse control is okay.  Established Problem, Stable/Improving (1), Review of Psycho-Social Stressors (1), Review or order clinical lab tests (1), Review and summation of old records (2), Review of Last Therapy Session (1) and Review of Medication Regimen & Side Effects (2)  Assessment: Axis I: Bipolar disorder  mixed  Axis II: Deferred  Axis III: See medical history.    Plan:  I reviewed records from his primary care physician.  His last hemoglobin A1c which was drawn on January 13 was 6.4.  His basic chemistry shows mild elevation of AST otherwise normal.  His glucose was 233.  His cholesterol is 162 however his triglyceride and VLDL was slightly high.  Patient is not happy because is taking for medication for hypertension.  He wants to get second opinion for his chronic health issues.  However he like to continue Lamictal 150 mg twice a day and Seroquel 50 mg at bedtime.  We have cut down his Seroquel however he continued to have weight gain.  I encouraged him to discuss weight gain issue with his primary care physician.  Patient has no itching or rash.  Discussed medication side effects and benefits.  I will see him again in 3 months. Time spent 25 minutes.  More than 50% of the time spent in psychoeducation, counseling and coordination of care.  Discuss safety plan that anytime having active suicidal thoughts or homicidal thoughts then patient need to  call 911 or go to the local emergency room.   ARFEEN,SYED T., MD 02/10/2015

## 2015-03-14 ENCOUNTER — Other Ambulatory Visit (HOSPITAL_COMMUNITY): Payer: Self-pay | Admitting: Psychiatry

## 2015-03-15 ENCOUNTER — Other Ambulatory Visit (HOSPITAL_COMMUNITY): Payer: Self-pay | Admitting: Psychiatry

## 2015-03-15 DIAGNOSIS — F319 Bipolar disorder, unspecified: Secondary | ICD-10-CM

## 2015-03-15 MED ORDER — HYDROXYZINE HCL 10 MG PO TABS: ORAL_TABLET | ORAL | Status: DC

## 2015-04-07 ENCOUNTER — Other Ambulatory Visit (HOSPITAL_COMMUNITY): Payer: Self-pay | Admitting: Psychiatry

## 2015-04-12 ENCOUNTER — Telehealth (HOSPITAL_COMMUNITY): Payer: Self-pay | Admitting: *Deleted

## 2015-04-12 ENCOUNTER — Other Ambulatory Visit (HOSPITAL_COMMUNITY): Payer: Self-pay | Admitting: Psychiatry

## 2015-04-12 DIAGNOSIS — F319 Bipolar disorder, unspecified: Secondary | ICD-10-CM

## 2015-04-12 MED ORDER — HYDROXYZINE HCL 10 MG PO TABS: ORAL_TABLET | ORAL | Status: DC

## 2015-04-12 NOTE — Telephone Encounter (Signed)
Dr.  Adele Schilder,   Patient request refill on Hydroxyzine.  Medication was sent 03-15-15 with no refills.  Is it okay to refill?   Please advise

## 2015-04-12 NOTE — Telephone Encounter (Signed)
done

## 2015-05-12 ENCOUNTER — Encounter (HOSPITAL_COMMUNITY): Payer: Self-pay | Admitting: Psychiatry

## 2015-05-12 ENCOUNTER — Ambulatory Visit (INDEPENDENT_AMBULATORY_CARE_PROVIDER_SITE_OTHER): Payer: Medicare HMO | Admitting: Psychiatry

## 2015-05-12 VITALS — BP 154/91 | HR 98 | Ht 70.0 in | Wt 377.4 lb

## 2015-05-12 DIAGNOSIS — F3160 Bipolar disorder, current episode mixed, unspecified: Secondary | ICD-10-CM | POA: Diagnosis not present

## 2015-05-12 DIAGNOSIS — F419 Anxiety disorder, unspecified: Secondary | ICD-10-CM

## 2015-05-12 DIAGNOSIS — F319 Bipolar disorder, unspecified: Secondary | ICD-10-CM

## 2015-05-12 MED ORDER — HYDROXYZINE HCL 10 MG PO TABS: ORAL_TABLET | ORAL | Status: DC

## 2015-05-12 MED ORDER — QUETIAPINE FUMARATE 50 MG PO TABS: 50.0000 mg | ORAL_TABLET | Freq: Every day | ORAL | Status: DC

## 2015-05-12 MED ORDER — LAMOTRIGINE 150 MG PO TABS: ORAL_TABLET | ORAL | Status: DC

## 2015-05-12 NOTE — Progress Notes (Signed)
[facility] Progress Note  [name] [id number] 60 y.o.  07/06/2013 2:42 PM  Chief Complaint:  Medication management and follow-up.         History of Present Illness:  Viral came for his followup appointment with his wife.  He is taking his medication as prescribed.  He wants to take hydroxyzine every day because it is helping his anxiety and sleep.  Today he is in a lot of pain because of decubitus ulcer on both of his legs.  He is getting tramadol for some time it is not helpful.  He is not happy with his primary care physician because he believes he is taking too many medication for his health issues.  Patient also noticed shortness of breath and get easily tired which could be due to being overweight .  However he described his mood is much better with the medication.  He denies any recent irritability, anger, mood swing or any aggressive behavior.  He sleeping better.  He denies any paranoia or any hallucination.  He is concerned about his physical health however he denies any feeling of hopelessness or worthlessness.  He denies any suicidal thoughts.  His energy level is okay.  His appetite is okay.  He has lost some weight from the past.  He is taking Seroquel 50 mg at bedtime, Lamictal 150 mg twice a day and Vistaril 10 mg daily.  He has no rash, itching, tremors, shakes or any other side effects.  Patient denies drinking or using any illegal substances.  He lives with his wife who is very supportive and also a patient in this office.  Suicidal Ideation: No Plan Formed: No Patient has means to carry out plan: No  Homicidal Ideation: No Plan Formed: No Patient has means to carry out plan: No  Review of Systems  Constitutional: Positive for malaise/fatigue.  Respiratory: Positive for shortness of breath.   Cardiovascular: Negative for chest pain and palpitations.  Musculoskeletal: Positive for back pain and joint pain.  Skin: Negative for itching and rash.   Neurological: Negative for dizziness.   Psychiatric: Agitation: No Hallucination: No Depressed Mood: No Insomnia: No Hypersomnia: No Altered Concentration: No Feels Worthless: No Grandiose Ideas: No Belief In Special Powers: No New/Increased Substance Abuse: No Compulsions: No  Neurologic: Headache: No Seizure: No Paresthesias: No  Past Medical History:  Obesity, obstructive sleep apnea, DM, GERD, history of bone cancer sarcoma, history of hernia repair.  His primary care physician is Dr. Ashok Norris in [place].  Outpatient Encounter Prescriptions as of 05/12/2015  Medication Sig  . amLODipine (NORVASC) 10 MG tablet Take 10 mg by mouth daily.  Marland Kitchen atorvastatin (LIPITOR) 20 MG tablet   . carvedilol (COREG) 3.125 MG tablet   . fexofenadine-pseudoephedrine (ALLEGRA-D 24) 180-240 MG per 24 hr tablet   . glipiZIDE (GLUCOTROL) 5 MG tablet   . hydrOXYzine (ATARAX/VISTARIL) 10 MG tablet TAKE ONE TABLET BY MOUTH DAILY  . lamoTRIgine (LAMICTAL) 150 MG tablet TAKE 1 TABLET BY MOUTH TWICE DAILY.  Marland Kitchen losartan (COZAAR) 100 MG tablet   . Melatonin 5 MG CAPS   . metFORMIN (GLUMETZA) 1000 MG (MOD) 24 hr tablet Take 1,000 mg by mouth daily with breakfast.  . mupirocin ointment (BACTROBAN) 2 %   . omeprazole (PRILOSEC) 40 MG capsule Take 40 mg by mouth daily.  . QUEtiapine (SEROQUEL) 50 MG tablet Take 1 tablet (50 mg total) by mouth at bedtime.  Marland Kitchen spironolactone (ALDACTONE) 100 MG tablet Take 100 mg by  mouth daily.  . traMADol (ULTRAM) 50 MG tablet   . triamcinolone cream (KENALOG) 0.1 %   . Vitamins A & D 5000-400 UNITS CAPS   . [DISCONTINUED] hydrOXYzine (ATARAX/VISTARIL) 10 MG tablet TAKE ONE TABLET BY MOUTH DAILY  . [DISCONTINUED] lamoTRIgine (LAMICTAL) 150 MG tablet TAKE 1 TABLET BY MOUTH TWICE DAILY.  . [DISCONTINUED] QUEtiapine (SEROQUEL) 50 MG tablet Take 1 tablet (50 mg total) by mouth at bedtime.   No facility-administered encounter medications on file as of 05/12/2015.     Past Psychiatric History/Hospitalization(s): Patient has psychiatric illness since 72.  He has 2 psychiatric hospitalization.  He was admitted in [place] at [facility].  Patient denies any history of suicidal attempt but admitted history of suicidal thinking, paranoia, hallucination, severe depression and aggression.  In the past he had tried to Depakote, Klonopin, Topamax, Zoloft, Wellbutrin, Neurontin, Zyprexa, Prozac, Paxil, Geodon, Risperdal, BuSpar, lithium with limited response. Anxiety: Yes Bipolar Disorder: Yes Depression: Yes Mania: Yes Psychosis: No Schizophrenia: No Personality Disorder: No Hospitalization for psychiatric illness: No History of Electroconvulsive Shock Therapy: No Prior Suicide Attempts: No  Physical Exam: Constitutional:  BP 154/91 mmHg  Pulse 98  Ht 5\' 10"  (1.778 m)  Wt 377 lb 6.4 oz (171.188 kg)  BMI 54.15 kg/m2  General Appearance: well nourished and obese  Musculoskeletal: Strength & Muscle Tone: within normal limits Gait & Station: normal Patient leans: N/A  Mental status examination: Patient is casually dressed and fairly groomed.  He appears easily tired and having shortness of breath .  He described chronic pain in his joint.  He maintained that eye contact.  His speech is slow but clear and coherent.  He described his mood anxious and his affect is mood appropriate.  His thought process slow but logical.  He denies any auditory or visual hallucination.  He denies any active or passive suicidal thoughts or homicidal thought.  His attention and concentration is okay.  There were no delusions, paranoia or any obsessive thoughts.  He has no tremors or shakes.  His psychomotor activity is slow.  He has no tremors or shakes.  His cognition is fairly intact.  He is alert and oriented 3.  His insight judgment and impulse control is okay.  Established Problem, Stable/Improving (1), Review of Last Therapy Session (1) and Review  of Medication Regimen & Side Effects (2)  Assessment: Axis I: Bipolar disorder mixed, anxiety disorder NOS  Axis II: Deferred  Axis III: See medical history.    Plan:  Patient doing better on Seroquel, Lamictal and Vistaril.  He has no side effects.  Discussed medication side effects and benefits.  Discuss safety concerns and recommended to call us back if he has any question or any concern.  Discussed weight maintenance and sleep hygiene.  Encouraged to keep appointment with primary care physician in regards to medical condition.  Patient is looking for a different primary care physician since he is not happy that he is taking too many medication.  Patient has a lot of health issues.  I will see him again in 3 months.  Patient wants hydroxyzine to be given for 90 days since he is taking every day.    Shakeia Krus T., MD 05/12/2015

## 2015-05-21 ENCOUNTER — Other Ambulatory Visit (HOSPITAL_COMMUNITY): Payer: Self-pay | Admitting: Psychiatry

## 2015-05-23 NOTE — Telephone Encounter (Signed)
Refill requests for patient's Lamictal and Seroquel declined as new orders were e-scribed to [facility] on 05/12/15.

## 2015-06-02 ENCOUNTER — Other Ambulatory Visit (HOSPITAL_COMMUNITY): Payer: Self-pay | Admitting: Psychiatry

## 2015-06-02 ENCOUNTER — Telehealth (HOSPITAL_COMMUNITY): Payer: Self-pay | Admitting: *Deleted

## 2015-06-02 DIAGNOSIS — F319 Bipolar disorder, unspecified: Secondary | ICD-10-CM

## 2015-06-02 MED ORDER — HYDROXYZINE HCL 10 MG PO TABS: ORAL_TABLET | ORAL | Status: DC

## 2015-06-02 NOTE — Telephone Encounter (Signed)
Dr. Adele Schilder,   Patient called and stated that the Hydroxyzine will cost more at the [facility] and request that the RX be sent to the [facility] in [facility] [address].   I advised patient I will have to contact [facility] and send message to Dr. Adele Schilder, then he will get a call back.   I called [facility] and spoke with Dewayne Hatch.  Per Estill Bamberg patient did call in and cancel the Hydroxyzine RX because it will cost $40.   Is it okay to send the RX to the [facility]?

## 2015-06-07 ENCOUNTER — Telehealth (HOSPITAL_COMMUNITY): Payer: Self-pay

## 2015-06-07 NOTE — Telephone Encounter (Signed)
Telephone call with patient to follow up on message he left with problems getting refills of needed Lamictal and Seroquel from [facility].  [facility] who verified they sent out 90 day supplies of each medication on 05/18/15.  Called patient back who put his wife on the phone and she verified the medications had been received.  Ms. Skelley questioned if a new Hydroxyzine order had been completed for patient.  Informed Ms. Mariel Kansky and Mr. Bernier Dr. Adele Schilder had e-scribed a new 25 day order to [facility] at [facility] in [place].  Both stated they would go there to get refill and call back if any more problems.

## 2015-07-05 ENCOUNTER — Other Ambulatory Visit (HOSPITAL_COMMUNITY): Payer: Self-pay | Admitting: Psychiatry

## 2015-07-06 NOTE — Telephone Encounter (Signed)
New 90 day orders of patient's prescribed Lamictal and Seroquel declined at this time due to too early to fill.  90 day orders for both were e-scribed to [facility] on 05/12/15.  Patient returns 08/15/15 so new order should be requested closer to appointment time.

## 2015-07-27 ENCOUNTER — Other Ambulatory Visit (HOSPITAL_COMMUNITY): Payer: Self-pay | Admitting: Psychiatry

## 2015-07-29 ENCOUNTER — Other Ambulatory Visit (HOSPITAL_COMMUNITY): Payer: Self-pay | Admitting: Psychiatry

## 2015-07-29 NOTE — Telephone Encounter (Signed)
Given on June 1 for 90 days.  Too soon to refill

## 2015-08-15 ENCOUNTER — Ambulatory Visit (HOSPITAL_COMMUNITY): Payer: Self-pay | Admitting: Psychiatry

## 2015-09-02 ENCOUNTER — Other Ambulatory Visit (HOSPITAL_COMMUNITY): Payer: Self-pay | Admitting: Psychiatry

## 2015-09-08 ENCOUNTER — Other Ambulatory Visit (HOSPITAL_COMMUNITY): Payer: Self-pay | Admitting: Psychiatry

## 2015-09-08 DIAGNOSIS — F319 Bipolar disorder, unspecified: Secondary | ICD-10-CM

## 2015-09-08 MED ORDER — LAMOTRIGINE 150 MG PO TABS: ORAL_TABLET | ORAL | Status: DC

## 2015-09-08 MED ORDER — QUETIAPINE FUMARATE 50 MG PO TABS: 50.0000 mg | ORAL_TABLET | Freq: Every day | ORAL | Status: DC

## 2015-10-12 ENCOUNTER — Other Ambulatory Visit (HOSPITAL_COMMUNITY): Payer: Self-pay | Admitting: Psychiatry

## 2015-11-04 ENCOUNTER — Other Ambulatory Visit (HOSPITAL_COMMUNITY): Payer: Self-pay | Admitting: Psychiatry

## 2015-11-04 NOTE — Telephone Encounter (Signed)
Given on 09/08/15 for 90 days. Too soon to refill.

## 2015-11-18 DIAGNOSIS — Z86010 Personal history of colonic polyps: Secondary | ICD-10-CM | POA: Insufficient documentation

## 2015-11-21 ENCOUNTER — Ambulatory Visit (HOSPITAL_COMMUNITY): Payer: Self-pay | Admitting: Psychiatry

## 2015-11-23 ENCOUNTER — Telehealth (HOSPITAL_COMMUNITY): Payer: Self-pay

## 2015-11-23 DIAGNOSIS — F319 Bipolar disorder, unspecified: Secondary | ICD-10-CM

## 2015-11-23 MED ORDER — HYDROXYZINE HCL 10 MG PO TABS: ORAL_TABLET | ORAL | Status: DC

## 2015-11-23 NOTE — Telephone Encounter (Signed)
Telephone message received from patient requesting a refill of his prescribed Hydroxyzine, last filled 06/02/15 for 90 days.  Patient was scheduled to see Dr. Arfeen on 11/21/15 but was cancelled due to Dr. Arfeen out that date and rescheduled for 01/10/16.  Met with Dr. Gerald  who authorized another 90 day order of patient's Hydroxyzine with plan for patient to keep appointment now set for 01/10/16.  Called patient to verify which pharmacy he wanted the order sent to and he requested [facility] in [place] on [address].  Reminded patient of need to keep rescheduled appointment set for 01/10/16 and new Hydroxyzine 90 day order e-scribed to patient's [facility] in [place] as requested.  

## 2015-12-08 ENCOUNTER — Other Ambulatory Visit (HOSPITAL_COMMUNITY): Payer: Self-pay | Admitting: Psychiatry

## 2015-12-08 DIAGNOSIS — F3162 Bipolar disorder, current episode mixed, moderate: Secondary | ICD-10-CM

## 2015-12-13 NOTE — Telephone Encounter (Signed)
Met with Dr. Adele Schilder who authorized a one time refill of patient's prescribed Lamictal with instruction patient must be evaluated for any further refills as last evaluated 05/12/15.  New order e-scribed to patient's [facility] with these instructions and only for 30 days as ordered by Dr. Adele Schilder.

## 2015-12-14 ENCOUNTER — Telehealth (HOSPITAL_COMMUNITY): Payer: Self-pay

## 2015-12-14 DIAGNOSIS — F3162 Bipolar disorder, current episode mixed, moderate: Secondary | ICD-10-CM

## 2015-12-14 MED ORDER — LAMOTRIGINE 150 MG PO TABS: 150.0000 mg | ORAL_TABLET | Freq: Two times a day (BID) | ORAL | Status: DC

## 2015-12-14 NOTE — Telephone Encounter (Signed)
Met with Dr. Adele Schilder after patient called back requesting we send in a 90 day order for his Lamictal due to cost.  Dr. Adele Schilder approved and a new order was e-scribed to [facility] for a 90 day supply of patient's Lamictal with instruction to cancel any previous orders.  Called and informed patient and his wife of order sent and that no further orders for 90 day would be supplied until patient is evaluated. Patient and his wife reported plan to keep appointment set for 01/10/16.

## 2015-12-14 NOTE — Telephone Encounter (Signed)
Met with Dr.Arfeen after patient

## 2016-01-10 ENCOUNTER — Ambulatory Visit (HOSPITAL_COMMUNITY): Payer: Self-pay | Admitting: Psychiatry

## 2016-01-24 ENCOUNTER — Encounter (HOSPITAL_COMMUNITY): Payer: Self-pay | Admitting: Psychiatry

## 2016-01-24 ENCOUNTER — Ambulatory Visit (INDEPENDENT_AMBULATORY_CARE_PROVIDER_SITE_OTHER): Payer: Medicare HMO | Admitting: Psychiatry

## 2016-01-24 VITALS — BP 130/80 | HR 98 | Ht 70.0 in | Wt 340.0 lb

## 2016-01-24 DIAGNOSIS — F3162 Bipolar disorder, current episode mixed, moderate: Secondary | ICD-10-CM | POA: Diagnosis not present

## 2016-01-24 DIAGNOSIS — F319 Bipolar disorder, unspecified: Secondary | ICD-10-CM

## 2016-01-24 DIAGNOSIS — F419 Anxiety disorder, unspecified: Secondary | ICD-10-CM | POA: Diagnosis not present

## 2016-01-24 MED ORDER — QUETIAPINE FUMARATE 50 MG PO TABS: 50.0000 mg | ORAL_TABLET | Freq: Every day | ORAL | Status: DC

## 2016-01-24 MED ORDER — LAMOTRIGINE 150 MG PO TABS: 150.0000 mg | ORAL_TABLET | Freq: Two times a day (BID) | ORAL | Status: DC

## 2016-01-24 MED ORDER — HYDROXYZINE HCL 10 MG PO TABS: ORAL_TABLET | ORAL | Status: DC

## 2016-01-24 NOTE — Progress Notes (Signed)
[place] [phone] Progress Note  [name] [id number] 61 y.o.  Chief Complaint:  Medication management and follow-up.         History of Present Illness:  [name] came for his followup appointment with his wife.  He has not seen since May 2016.  He was sick and they will of venous insufficiency and then underwent multiple surgeries and rehabilitation.  His wife told that doctor told that he will lose his leg however things started to get better and he has extensive rehabilitation and now he is feeling better.  He is able to walk with the help of walker .  Patient also feeling much improvement in his energy level.  He is taking his medication as prescribed.  He is seeing a new primary care physician who has been helping him a lot.  Patient is taking his psychiatric medication.  He denies any irritability, anger, mood swing.  He was very sad when his sister who was living with him died 3 days before Christmas.  Patient told his sister has bipolar disorder and she died due to natural causes.  Patient told it was a very sad Christmas but now things are getting better.  Patient is taking his medication as prescribed.  He denies any rash, itching, irritability or any side effects.  He sleeping good most of the time.  He still have weakness in his leg but it is getting better.  He denies any aggressive behavior or any impulsive behavior.  In past 6 months he has lost more than 30 pounds and he feel very good about it.  He is taking Vistaril, Seroquel, Lamictal.  He wants to continue his current medication.  He had blood work 2 months ago which shows WBC count 11.2 and his blood glucose 188.  Suicidal Ideation: No Plan Formed: No Patient has means to carry out plan: No  Homicidal Ideation: No Plan Formed: No Patient has means to carry out plan: No  Review of Systems  Constitutional: Positive for weight loss.  Cardiovascular: Negative for chest pain and palpitations.  Musculoskeletal:  Positive for back pain and joint pain.  Skin: Negative for itching and rash.  Neurological: Negative for dizziness and headaches.   Psychiatric: Agitation: No Hallucination: No Depressed Mood: No Insomnia: No Hypersomnia: No Altered Concentration: No Feels Worthless: No Grandiose Ideas: No Belief In Special Powers: No New/Increased Substance Abuse: No Compulsions: No  Neurologic: Headache: No Seizure: No Paresthesias: No  Past Medical History:  Obesity, obstructive sleep apnea, DM, GERD, history of bone cancer sarcoma, history of hernia repair.  His primary care physician is Dr. Ashok Mccall in [place].  Outpatient Encounter Prescriptions as of 01/24/2016  Medication Sig  . amLODipine (NORVASC) 10 MG tablet Take 10 mg by mouth daily.  . carvedilol (COREG) 3.125 MG tablet   . fexofenadine-pseudoephedrine (ALLEGRA-D 24) 180-240 MG per 24 hr tablet   . glipiZIDE (GLUCOTROL) 5 MG tablet   . hydrOXYzine (ATARAX/VISTARIL) 10 MG tablet TAKE ONE TABLET BY MOUTH DAILY  . lamoTRIgine (LAMICTAL) 150 MG tablet Take 1 tablet (150 mg total) by mouth 2 (two) times daily.  Marland Kitchen losartan (COZAAR) 100 MG tablet   . Melatonin 5 MG CAPS   . metFORMIN (GLUMETZA) 1000 MG (MOD) 24 hr tablet Take 1,000 mg by mouth daily with breakfast.  . mupirocin ointment (BACTROBAN) 2 %   . oxyCODONE (OXY IR/ROXICODONE) 5 MG immediate release tablet   . QUEtiapine (SEROQUEL) 50 MG tablet Take 1 tablet (50  mg total) by mouth at bedtime.  Marland Kitchen spironolactone (ALDACTONE) 100 MG tablet Take 100 mg by mouth daily.  . traMADol (ULTRAM) 50 MG tablet   . triamcinolone cream (KENALOG) 0.1 %   . [DISCONTINUED] atorvastatin (LIPITOR) 20 MG tablet   . [DISCONTINUED] hydrOXYzine (ATARAX/VISTARIL) 10 MG tablet TAKE ONE TABLET BY MOUTH DAILY  . [DISCONTINUED] lamoTRIgine (LAMICTAL) 150 MG tablet Take 1 tablet (150 mg total) by mouth 2 (two) times daily.  . [DISCONTINUED] omeprazole (PRILOSEC) 40 MG capsule Take 40 mg by  mouth daily.  . [DISCONTINUED] QUEtiapine (SEROQUEL) 50 MG tablet Take 1 tablet (50 mg total) by mouth at bedtime.  . [DISCONTINUED] Vitamins A & D 5000-400 UNITS CAPS    No facility-administered encounter medications on file as of 01/24/2016.    Past Psychiatric History/Hospitalization(s): Patient has psychiatric illness since 58.  He has 2 psychiatric hospitalization.  He was admitted in [place] at [facility].  Patient denies any history of suicidal attempt but admitted history of suicidal thinking, paranoia, hallucination, severe depression and aggression.  In the past he had tried to Depakote, Klonopin, Topamax, Zoloft, Wellbutrin, Neurontin, Zyprexa, Prozac, Paxil, Geodon, Risperdal, BuSpar, lithium with limited response. Anxiety: Yes Bipolar Disorder: Yes Depression: Yes Mania: Yes Psychosis: No Schizophrenia: No Personality Disorder: No Hospitalization for psychiatric illness: No History of Electroconvulsive Shock Therapy: No Prior Suicide Attempts: No  Physical Exam: Constitutional:  BP 130/80 mmHg  Pulse 98  Ht 5\' 10"  (1.778 m)  Wt 340 lb (154.223 kg)  BMI 48.78 kg/m2  General Appearance: well nourished and obese  Musculoskeletal: Strength & Muscle Tone: within normal limits Gait & Station: normal Patient leans: N/A  Mental status examination: Patient is casually dressed and fairly groomed.  He is walking with the help of walker .  He described his mood euthymic and his affect is appropriate.  His speech is slow but clear and coherent.  His thought process slow but logical.  He denies any auditory or visual hallucination.  He denies any active or passive suicidal thoughts or homicidal thought.  His attention and concentration is okay.  There were no delusions, paranoia or any obsessive thoughts.  He has no tremors or shakes.  His psychomotor activity is slow.  He has no tremors or shakes.  His cognition is fairly intact.  He is alert and oriented  3.  His insight judgment and impulse control is okay.  Established Problem, Stable/Improving (1), Review of Psycho-Social Stressors (1), Review or order clinical lab tests (1), Decision to obtain old records (1), Review and summation of old records (2), Review of Last Therapy Session (1) and Review of Medication Regimen & Side Effects (2)  Assessment: Axis I: Bipolar disorder mixed, anxiety disorder NOS  Axis II: Deferred  Axis III: See medical history.    Plan:  I review records from his primary care physician, recent blood work results and collateral information.  Patient is slowly improving and physically he is feeling much better.  His wife also endorse much improvement in his energy level and walk.  He like to continue Lamictal and Seroquel and Vistaril.  He takes Vistaril only as needed.  He denies any side effects including any rash, itching, EPS or any shakes.  Discussed medication side effects and benefits.  Discuss safety concerns and recommended to call us back if he has any question or any concern. I will see him again in 3 months.  Patient wants his medication to be given for 90  days except Hydroxacen which he takes only as needed.  Time spent 25 minutes.  More than 50% of the time spent in psychoeducation, counseling and coordination of care.  Discuss safety plan that anytime having active suicidal thoughts or homicidal thoughts then patient need to call 911 or go to the local emergency room.      Heela Heishman T., MD 01/24/2016

## 2016-02-28 ENCOUNTER — Other Ambulatory Visit (HOSPITAL_COMMUNITY): Payer: Self-pay | Admitting: Psychiatry

## 2016-02-28 DIAGNOSIS — F3162 Bipolar disorder, current episode mixed, moderate: Secondary | ICD-10-CM

## 2016-02-28 NOTE — Telephone Encounter (Signed)
Patient needs a refill on Hydroxyzine - last filled at his visit on 1/24 and he has a f/u on 5/11. Okay to fill? Please advise, thank you

## 2016-03-01 NOTE — Telephone Encounter (Signed)
Met with Dr. Adele Schilder who approved a one time refill of patient's Hydroxyzine 10 mg, once daily as needed, #30 with no refills and new order e-scribed to patient's  [facility] in [facility] on [facility].

## 2016-04-11 ENCOUNTER — Other Ambulatory Visit (HOSPITAL_COMMUNITY): Payer: Self-pay | Admitting: Psychiatry

## 2016-04-12 ENCOUNTER — Other Ambulatory Visit (HOSPITAL_COMMUNITY): Payer: Self-pay | Admitting: Psychiatry

## 2016-04-15 ENCOUNTER — Other Ambulatory Visit (HOSPITAL_COMMUNITY): Payer: Self-pay | Admitting: Psychiatry

## 2016-04-16 ENCOUNTER — Other Ambulatory Visit (HOSPITAL_COMMUNITY): Payer: Self-pay | Admitting: Psychiatry

## 2016-04-16 ENCOUNTER — Telehealth (HOSPITAL_COMMUNITY): Payer: Self-pay

## 2016-04-16 DIAGNOSIS — F3162 Bipolar disorder, current episode mixed, moderate: Secondary | ICD-10-CM

## 2016-04-16 MED ORDER — LAMOTRIGINE 150 MG PO TABS: 150.0000 mg | ORAL_TABLET | Freq: Two times a day (BID) | ORAL | Status: DC

## 2016-04-16 NOTE — Telephone Encounter (Signed)
Fax received from patients pharmacy, he needs a refill on Lamotrigine 150mg  2 times daily. Patient was last here on 1/21 and has a follow up on 6/14, Please review and advise, thank you

## 2016-04-17 NOTE — Telephone Encounter (Signed)
Refill done by Dr Adele Schilder.

## 2016-04-18 ENCOUNTER — Telehealth (HOSPITAL_COMMUNITY): Payer: Self-pay

## 2016-04-18 DIAGNOSIS — F3162 Bipolar disorder, current episode mixed, moderate: Secondary | ICD-10-CM

## 2016-04-18 MED ORDER — HYDROXYZINE HCL 10 MG PO TABS: 10.0000 mg | ORAL_TABLET | Freq: Every day | ORAL | Status: DC

## 2016-04-18 NOTE — Telephone Encounter (Signed)
This was refilled.

## 2016-05-04 ENCOUNTER — Other Ambulatory Visit (HOSPITAL_COMMUNITY): Payer: Self-pay | Admitting: Psychiatry

## 2016-05-10 ENCOUNTER — Ambulatory Visit (HOSPITAL_COMMUNITY): Payer: Self-pay | Admitting: Psychiatry

## 2016-05-14 ENCOUNTER — Other Ambulatory Visit (HOSPITAL_COMMUNITY): Payer: Self-pay | Admitting: Psychiatry

## 2016-05-23 ENCOUNTER — Other Ambulatory Visit (HOSPITAL_COMMUNITY): Payer: Self-pay | Admitting: Psychiatry

## 2016-06-09 ENCOUNTER — Other Ambulatory Visit (HOSPITAL_COMMUNITY): Payer: Self-pay | Admitting: Psychiatry

## 2016-06-11 ENCOUNTER — Ambulatory Visit (HOSPITAL_COMMUNITY): Payer: Self-pay | Admitting: Psychiatry

## 2016-06-13 ENCOUNTER — Encounter (HOSPITAL_COMMUNITY): Payer: Self-pay | Admitting: Psychiatry

## 2016-06-13 ENCOUNTER — Ambulatory Visit (INDEPENDENT_AMBULATORY_CARE_PROVIDER_SITE_OTHER): Payer: Medicare HMO | Admitting: Psychiatry

## 2016-06-13 VITALS — BP 132/84 | HR 92 | Ht 70.0 in | Wt 364.2 lb

## 2016-06-13 DIAGNOSIS — F419 Anxiety disorder, unspecified: Secondary | ICD-10-CM

## 2016-06-13 DIAGNOSIS — F3162 Bipolar disorder, current episode mixed, moderate: Secondary | ICD-10-CM

## 2016-06-13 DIAGNOSIS — F319 Bipolar disorder, unspecified: Secondary | ICD-10-CM

## 2016-06-13 MED ORDER — QUETIAPINE FUMARATE 50 MG PO TABS: 50.0000 mg | ORAL_TABLET | Freq: Every day | ORAL | Status: DC

## 2016-06-13 MED ORDER — LAMOTRIGINE 150 MG PO TABS: 150.0000 mg | ORAL_TABLET | Freq: Two times a day (BID) | ORAL | Status: DC

## 2016-06-13 MED ORDER — HYDROXYZINE HCL 10 MG PO TABS: 10.0000 mg | ORAL_TABLET | Freq: Every day | ORAL | Status: DC

## 2016-06-13 NOTE — Progress Notes (Signed)
[place] [phone] Progress Note  [name] [id number] 61 y.o.  Chief Complaint:  Medication management and follow-up.         History of Present Illness:  [name] came for his followup appointment with his wife.  He was last seen in January.  He is taking the medication as prescribed.  He likes Vistaril because helping his sleep and anxiety.  He is using walker as he continued to recover from multiple surgeries .  He described his mood is much better and he is sleeping better.  He denies any irritability, anger, paranoia or any hallucination.  His wife endorsed that he is best so far on his medication.  He has no issues or tremors.  He denies any aggression or any self abusive behavior.  He has gained weight which she believed due to not being social, active or walking.  He admitted lately nights snacks does not help him.  Patient denies drinking or using any illegal substances.  He wants to continue Seroquel, Lamictal and Vistaril.  His appetite is increased and he has gained more than 15 pounds in past few months.  Suicidal Ideation: No Plan Formed: No Patient has means to carry out plan: No  Homicidal Ideation: No Plan Formed: No Patient has means to carry out plan: No  Review of Systems  Constitutional: Negative for weight loss.  Cardiovascular: Negative for chest pain and palpitations.  Musculoskeletal: Positive for back pain and joint pain.  Skin: Negative for itching and rash.  Neurological: Negative for dizziness and headaches.   Psychiatric: Agitation: No Hallucination: No Depressed Mood: No Insomnia: No Hypersomnia: No Altered Concentration: No Feels Worthless: No Grandiose Ideas: No Belief In Special Powers: No New/Increased Substance Abuse: No Compulsions: No  Neurologic: Headache: No Seizure: No Paresthesias: No  Past Medical History:  Obesity, obstructive sleep apnea, DM, GERD, history of bone cancer sarcoma, history of hernia repair.  His  primary care physician is Dr. Ashok Norris in [place].  Outpatient Encounter Prescriptions as of 06/13/2016  Medication Sig  . amLODipine (NORVASC) 10 MG tablet Take 10 mg by mouth daily.  . carvedilol (COREG) 3.125 MG tablet   . fexofenadine-pseudoephedrine (ALLEGRA-D 24) 180-240 MG per 24 hr tablet   . glipiZIDE (GLUCOTROL) 5 MG tablet   . hydrOXYzine (ATARAX/VISTARIL) 10 MG tablet Take 1 tablet (10 mg total) by mouth daily.  Marland Kitchen lamoTRIgine (LAMICTAL) 150 MG tablet Take 1 tablet (150 mg total) by mouth 2 (two) times daily.  Marland Kitchen losartan (COZAAR) 100 MG tablet   . Melatonin 5 MG CAPS   . metFORMIN (GLUMETZA) 1000 MG (MOD) 24 hr tablet Take 1,000 mg by mouth daily with breakfast.  . mupirocin ointment (BACTROBAN) 2 %   . oxyCODONE (OXY IR/ROXICODONE) 5 MG immediate release tablet   . QUEtiapine (SEROQUEL) 50 MG tablet Take 1 tablet (50 mg total) by mouth at bedtime.  Marland Kitchen spironolactone (ALDACTONE) 100 MG tablet Take 100 mg by mouth daily.  . traMADol (ULTRAM) 50 MG tablet   . triamcinolone cream (KENALOG) 0.1 %   . [DISCONTINUED] hydrOXYzine (ATARAX/VISTARIL) 10 MG tablet Take 1 tablet (10 mg total) by mouth daily.  . [DISCONTINUED] lamoTRIgine (LAMICTAL) 150 MG tablet Take 1 tablet (150 mg total) by mouth 2 (two) times daily.  . [DISCONTINUED] lamoTRIgine (LAMICTAL) 150 MG tablet Take 1 tablet (150 mg total) by mouth 2 (two) times daily.  . [DISCONTINUED] QUEtiapine (SEROQUEL) 50 MG tablet Take 1 tablet (50 mg total) by mouth at bedtime.  . [  DISCONTINUED] QUEtiapine (SEROQUEL) 50 MG tablet Take 1 tablet (50 mg total) by mouth at bedtime.   No facility-administered encounter medications on file as of 06/13/2016.    Past Psychiatric History/Hospitalization(s): Patient has psychiatric illness since 70.  He has 2 psychiatric hospitalization.  He was admitted in [place] at [facility]  Patient denies any history of suicidal attempt but admitted history of suicidal  thinking, paranoia, hallucination, severe depression and aggression.  In the past he had tried to Depakote, Klonopin, Topamax, Zoloft, Wellbutrin, Neurontin, Zyprexa, Prozac, Paxil, Geodon, Risperdal, BuSpar, lithium with limited response. Anxiety: Yes Bipolar Disorder: Yes Depression: Yes Mania: Yes Psychosis: No Schizophrenia: No Personality Disorder: No Hospitalization for psychiatric illness: No History of Electroconvulsive Shock Therapy: No Prior Suicide Attempts: No  Physical Exam: Constitutional:  BP 132/84 mmHg  Pulse 92  Ht 5\' 10"  (1.778 m)  Wt 364 lb 3.2 oz (165.2 kg)  BMI 52.26 kg/m2  General Appearance: well nourished and obese  Musculoskeletal: Strength & Muscle Tone: within normal limits Gait & Station: normal Patient leans: N/A  Mental status examination: Patient is casually dressed and fairly groomed.  He is walking with the help of walker.   His his speech is slow but clear and coherent.  He described his mood euthymic and his affect is appropriate.  His thought process slow but logical.  He denies any auditory or visual hallucination.  He denies any active or passive suicidal thoughts or homicidal thought.  His attention and concentration is okay.  There were no delusions, paranoia or any obsessive thoughts.  He has no tremors or shakes.  His psychomotor activity is slow.  He has no tremors or shakes.  His cognition is fairly intact.  He is alert and oriented 3.  His insight judgment and impulse control is okay.  Established Problem, Stable/Improving (1), Review of Psycho-Social Stressors (1), Review of Last Therapy Session (1) and Review of Medication Regimen & Side Effects (2)  Assessment: Axis I: Bipolar disorder mixed, anxiety disorder NOS  Axis II: Deferred  Axis III: See medical history.    Plan:  Patient doing better on his current psychiatric medication.  He denies any aggressive behavior.  I will continue Lamictal 150 mg twice a day, Seroquel 50 mg  at bedtime and Vistaril 10 mg as needed for anxiety.  Patient has no rash or itching.  Recommended to call us back if he is any question, concern.  Fasting of the symptom.  Follow-up in 6 months.    Dervin Vore T., MD 06/13/2016

## 2016-06-20 ENCOUNTER — Other Ambulatory Visit (HOSPITAL_COMMUNITY): Payer: Self-pay | Admitting: Psychiatry

## 2016-11-17 ENCOUNTER — Other Ambulatory Visit (HOSPITAL_COMMUNITY): Payer: Self-pay | Admitting: Psychiatry

## 2016-11-17 DIAGNOSIS — F319 Bipolar disorder, unspecified: Secondary | ICD-10-CM

## 2016-11-17 DIAGNOSIS — F3162 Bipolar disorder, current episode mixed, moderate: Secondary | ICD-10-CM

## 2016-11-19 ENCOUNTER — Other Ambulatory Visit (HOSPITAL_COMMUNITY): Payer: Self-pay | Admitting: Psychiatry

## 2016-11-19 DIAGNOSIS — F3162 Bipolar disorder, current episode mixed, moderate: Secondary | ICD-10-CM

## 2016-11-19 DIAGNOSIS — F319 Bipolar disorder, unspecified: Secondary | ICD-10-CM

## 2016-11-19 MED ORDER — LAMOTRIGINE 150 MG PO TABS: 150.0000 mg | ORAL_TABLET | Freq: Two times a day (BID) | ORAL | 0 refills | Status: DC

## 2016-11-19 MED ORDER — QUETIAPINE FUMARATE 50 MG PO TABS: 50.0000 mg | ORAL_TABLET | Freq: Every day | ORAL | 0 refills | Status: DC

## 2016-12-17 ENCOUNTER — Ambulatory Visit (HOSPITAL_COMMUNITY): Payer: Self-pay | Admitting: Psychiatry

## 2016-12-19 ENCOUNTER — Encounter (HOSPITAL_COMMUNITY): Payer: Self-pay | Admitting: Psychiatry

## 2016-12-19 ENCOUNTER — Ambulatory Visit (INDEPENDENT_AMBULATORY_CARE_PROVIDER_SITE_OTHER): Payer: Medicare HMO | Admitting: Psychiatry

## 2016-12-19 DIAGNOSIS — Z79899 Other long term (current) drug therapy: Secondary | ICD-10-CM | POA: Diagnosis not present

## 2016-12-19 DIAGNOSIS — F3162 Bipolar disorder, current episode mixed, moderate: Secondary | ICD-10-CM

## 2016-12-19 DIAGNOSIS — Z888 Allergy status to other drugs, medicaments and biological substances status: Secondary | ICD-10-CM | POA: Diagnosis not present

## 2016-12-19 DIAGNOSIS — F319 Bipolar disorder, unspecified: Secondary | ICD-10-CM

## 2016-12-19 MED ORDER — LAMOTRIGINE 150 MG PO TABS: 150.0000 mg | ORAL_TABLET | Freq: Two times a day (BID) | ORAL | 0 refills | Status: DC

## 2016-12-19 MED ORDER — QUETIAPINE FUMARATE 50 MG PO TABS: 50.0000 mg | ORAL_TABLET | Freq: Every day | ORAL | 0 refills | Status: DC

## 2016-12-19 MED ORDER — HYDROXYZINE HCL 10 MG PO TABS: 10.0000 mg | ORAL_TABLET | Freq: Every day | ORAL | 0 refills | Status: DC

## 2016-12-19 NOTE — Progress Notes (Signed)
BH MD/PA/NP OP Progress Note  12/19/2016 12:11 PM DORRANCE DEITCH  MRN:  [id number]  Chief Complaint:  Chief Complaint    Follow-up     Subjective:  I am doing fine now.  However last month I was sick and required multiple graft on my left leg.  HPI: Mr. Canan came for his follow-up appointment.  He is taking his medication and reported no side effects.  Last month he was admitted due to pain in his leg and require multiple grafting and now he is feeling much better.  He is using wheelchair because he still have difficulty walking and cannot stand on his feet.  He denies any aggressive behavior, self abusive behavior, irritability, anger, mania or psychosis.  He wants to continue his current psychiatric medication.  He sleeping good with the medication.  He is taking Seroquel, Lamictal and Vistaril.  He admitted does not take Vistaril every day because he does not feel as anxious and he sleeping good.  Patient denies drinking alcohol or using any illegal substances.  He lives with his wife who is very supportive.  His appetite is okay.  His energy level is fair.  Patient recently visited [place] to see his family members and he has a good time.  Patient wanted to see them since her sister died last 01/18/24.  Visit Diagnosis:    ICD-9-CM ICD-10-CM   1. Bipolar 1 disorder (HCC) 296.7 F31.9 QUEtiapine (SEROQUEL) 50 MG tablet  2. Bipolar 1 disorder, mixed, moderate (HCC) 296.62 F31.62 lamoTRIgine (LAMICTAL) 150 MG tablet     hydrOXYzine (ATARAX/VISTARIL) 10 MG tablet    Past Psychiatric History: Patient has psychiatric illness since 1998.  He has 2 psychiatric hospitalization.  He denies any history of suicidal attempt but admitted history of suicidal thinking, paranoia, hallucination and severe depression and aggressive behavior.  In the past he had tried Depakote, Klonopin, Topamax, Zoloft, Wellbutrin, Neurontin, Zyprexa, Prozac, Paxil, Geodon, Risperdal, BuSpar, lithium with limited  response.  Past Medical History:  Past Medical History:  Diagnosis Date  . GERD (gastroesophageal reflux disease)   . HTN (hypertension)   . Obesity   . Sarcoma of bone [facility])     Past Surgical History:  Procedure Laterality Date  . HERNIA REPAIR      Family Psychiatric History: See below.  Family History: No family history on file.  Social History:  Social History   Social History  . Marital status: Married    Spouse name: N/A  . Number of children: N/A  . Years of education: N/A   Social History Main Topics  . Smoking status: Former Research scientist (life sciences)  . Smokeless tobacco: Never Used  . Alcohol use No  . Drug use: No  . Sexual activity: Not Currently   Other Topics Concern  . None   Social History Narrative  . None    Allergies:  Allergies  Allergen Reactions  . Buspar [Buspirone]   . Depakote [Divalproex Sodium]   . Klonopin [Clonazepam] Hives  . Nembutal [Pentobarbital Sodium]   . Neurontin [Gabapentin]   . Paxil [Paroxetine Hcl]   . Prozac [Fluoxetine Hcl]   . Risperdal [Risperidone]   . Topamax [Topiramate]   . Wellbutrin [Bupropion]   . Zoloft [Sertraline Hcl]   . Zyprexa [Olanzapine]     Metabolic Disorder Labs: Lab Results  Component Value Date   HGBA1C (H) 05/29/2009    6.9 (NOTE) The ADA recommends the following therapeutic goal for glycemic control related to Hgb A1c measurement: Goal  of therapy: <6.5 Hgb A1c  Reference: American Diabetes Association: Clinical Practice Recommendations 2010, Diabetes Care, 2010, 33: (Suppl  1).   MPG 151 05/29/2009   No results found for: PROLACTIN Lab Results  Component Value Date   CHOL (H) 05/29/2009    201        ATP III CLASSIFICATION:  <200     mg/dL   Desirable  200-239  mg/dL   Borderline High  >=240    mg/dL   High          TRIG 648 (H) 05/29/2009   HDL 29 (L) 05/29/2009   CHOLHDL 6.9 05/29/2009   VLDL UNABLE TO CALCULATE IF TRIGLYCERIDE OVER 400 mg/dL 05/29/2009   LDLCALC  05/29/2009    UNABLE  TO CALCULATE IF TRIGLYCERIDE OVER 400 mg/dL        Total Cholesterol/HDL:CHD Risk Coronary Heart Disease Risk Table                     Men   Women  1/2 Average Risk   3.4   3.3  Average Risk       5.0   4.4  2 X Average Risk   9.6   7.1  3 X Average Risk  23.4   11.0        Use the calculated Patient Ratio above and the CHD Risk Table to determine the patient's CHD Risk.        ATP III CLASSIFICATION (LDL):  <100     mg/dL   Optimal  100-129  mg/dL   Near or Above                    Optimal  130-159  mg/dL   Borderline  160-189  mg/dL   High  >190     mg/dL   Very High     Current Medications: Current Outpatient Prescriptions  Medication Sig Dispense Refill  . amLODipine (NORVASC) 10 MG tablet Take 10 mg by mouth daily.    . carvedilol (COREG) 3.125 MG tablet     . fexofenadine-pseudoephedrine (ALLEGRA-D 24) 180-240 MG per 24 hr tablet     . glipiZIDE (GLUCOTROL) 5 MG tablet     . hydrOXYzine (ATARAX/VISTARIL) 10 MG tablet Take 1 tablet (10 mg total) by mouth daily. 90 tablet 0  . lamoTRIgine (LAMICTAL) 150 MG tablet Take 1 tablet (150 mg total) by mouth 2 (two) times daily. 180 tablet 0  . losartan (COZAAR) 100 MG tablet     . Melatonin 5 MG CAPS     . metFORMIN (GLUMETZA) 1000 MG (MOD) 24 hr tablet Take 1,000 mg by mouth daily with breakfast.    . mupirocin ointment (BACTROBAN) 2 %     . oxyCODONE (OXY IR/ROXICODONE) 5 MG immediate release tablet     . QUEtiapine (SEROQUEL) 50 MG tablet Take 1 tablet (50 mg total) by mouth at bedtime. 90 tablet 0  . spironolactone (ALDACTONE) 100 MG tablet Take 100 mg by mouth daily.    . traMADol (ULTRAM) 50 MG tablet     . triamcinolone cream (KENALOG) 0.1 %      No current facility-administered medications for this visit.     Neurologic: Headache: No Seizure: No Paresthesias: No  Musculoskeletal: Strength & Muscle Tone: increased Gait & Station: unable to stand Patient leans: Patient is on wheelchair.  Psychiatric Specialty  Exam: ROS  Blood pressure 110/64, pulse 93, SpO2 94 %.There is no height or weight  on file to calculate BMI.  General Appearance: Fairly Groomed  Eye Contact:  Fair  Speech:  Slow  Volume:  Decreased  Mood:  Euthymic  Affect:  Appropriate  Thought Process:  Goal Directed  Orientation:  Full (Time, Place, and Person)  Thought Content: WDL and Logical   Suicidal Thoughts:  No  Homicidal Thoughts:  No  Memory:  Immediate;   Fair Recent;   Fair Remote;   Fair  Judgement:  Fair  Insight:  Fair  Psychomotor Activity:  Decreased  Concentration:  Concentration: Fair and Attention Span: Fair  Recall:  [employer] of Knowledge: Fair  Language: Good  Akathisia:  No  Handed:  Right  AIMS (if indicated):  None reported   Assets:  Communication Skills Desire for Improvement Social Support  ADL's:  Intact  Cognition: WNL  Sleep:  Good      Assessment ; Mr. Antonine came for his appointment.  He has diabetes of bipolar disorder and anxiety disorder.  Plan Patient is doing better on his current psychiatric medication.  Despite last month was stressful due to his physical illness he handled the situation very well.  He was not aggressive or agitated.  He wants to continue his current psychiatric medication.  I will continue Lamictal 150 mg twice a day, Seroquel 50 mg at bedtime.  He will also need Vistaril 10 mg as needed for anxiety.  Discussed medication side effects and benefits.  He has no rash, itching, tremors or any shakes.  Recommended to call us back if he is any question, concern if he feel worsening of the symptom.  Follow-up in 6 months.    Llewyn Heap T., MD 12/19/2016, 12:11 PM

## 2017-06-14 DIAGNOSIS — L97902 Non-pressure chronic ulcer of unspecified part of unspecified lower leg with fat layer exposed: Secondary | ICD-10-CM | POA: Insufficient documentation

## 2017-06-18 ENCOUNTER — Ambulatory Visit (HOSPITAL_COMMUNITY): Payer: Self-pay | Admitting: Psychiatry

## 2017-07-30 ENCOUNTER — Ambulatory Visit (HOSPITAL_COMMUNITY): Payer: Self-pay | Admitting: Psychiatry

## 2017-08-09 ENCOUNTER — Other Ambulatory Visit (HOSPITAL_COMMUNITY): Payer: Self-pay | Admitting: Psychiatry

## 2017-08-09 DIAGNOSIS — F3162 Bipolar disorder, current episode mixed, moderate: Secondary | ICD-10-CM

## 2017-08-19 ENCOUNTER — Ambulatory Visit (INDEPENDENT_AMBULATORY_CARE_PROVIDER_SITE_OTHER): Payer: Medicare Other | Admitting: Psychiatry

## 2017-08-19 ENCOUNTER — Encounter (HOSPITAL_COMMUNITY): Payer: Self-pay | Admitting: Psychiatry

## 2017-08-19 DIAGNOSIS — F419 Anxiety disorder, unspecified: Secondary | ICD-10-CM

## 2017-08-19 DIAGNOSIS — Z87891 Personal history of nicotine dependence: Secondary | ICD-10-CM | POA: Diagnosis not present

## 2017-08-19 DIAGNOSIS — F3162 Bipolar disorder, current episode mixed, moderate: Secondary | ICD-10-CM | POA: Diagnosis not present

## 2017-08-19 DIAGNOSIS — F319 Bipolar disorder, unspecified: Secondary | ICD-10-CM

## 2017-08-19 MED ORDER — QUETIAPINE FUMARATE 50 MG PO TABS: 50.0000 mg | ORAL_TABLET | Freq: Every day | ORAL | 0 refills | Status: DC

## 2017-08-19 MED ORDER — LAMOTRIGINE 150 MG PO TABS: 150.0000 mg | ORAL_TABLET | Freq: Two times a day (BID) | ORAL | 0 refills | Status: DC

## 2017-08-19 MED ORDER — HYDROXYZINE HCL 10 MG PO TABS: 10.0000 mg | ORAL_TABLET | Freq: Every day | ORAL | 0 refills | Status: DC

## 2017-08-19 NOTE — Progress Notes (Signed)
BH MD/PA/NP OP Progress Note  08/19/2017 1:04 PM [name]  MRN:  [id number]  Chief Complaint:  Subjective:  I am doing better with the medication.  I started to walk little bit and that's improvement.  HPI: Patient came for his follow-up appointment with his wife who is also a patient in this office.  He was last seen in December 2017.  Since then he has multiple leg surgery and skin graft.  But overall he is doing much better.  He still uses wheelchair but he is able to walk a few steps and he is clinically much improved from the past.  He denies any irritability, anger, mania or any psychosis.  He had good spirits and is looking forward to recovery.  He denies any crying spells or any suicidal thoughts.  He takes Lamictal and Seroquel every day and Vistaril only as needed.  He sleeping good.  He denies drinking alcohol or using any illegal substances.  His energy level is improved from the last visit.  He lives with his wife who is supportive.  His vital signs are stable.  He recently seen his primary care physician and he had blood work.  His last hemoglobin A1c is 6.4 which was drawn in 07/13/2017.  He is keeping his blood sugar under control.  Visit Diagnosis:    ICD-10-CM   1. Bipolar 1 disorder, mixed, moderate (HCC) F31.62 lamoTRIgine (LAMICTAL) 150 MG tablet    hydrOXYzine (ATARAX/VISTARIL) 10 MG tablet  2. Bipolar 1 disorder (HCC) F31.9 QUEtiapine (SEROQUEL) 50 MG tablet    Past Psychiatric History: Reviewed. Patient has psychiatric illness since 1998.  He has 2 psychiatric hospitalization.  He denies any history of suicidal attempt but admitted history of suicidal thinking, paranoia, hallucination and severe depression and aggressive behavior.  In the past he had tried Depakote, Klonopin, Topamax, Zoloft, Wellbutrin, Neurontin, Zyprexa, Prozac, Paxil, Geodon, Risperdal, BuSpar, lithium with limited response.  Past Medical History:  Past Medical History:  Diagnosis Date  . GERD  (gastroesophageal reflux disease)   . HTN (hypertension)   . Obesity   . 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 HERNIA REPAIR      Family Psychiatric History: Reviewed.  Family History: No family history on file.  Social History:  Social History   Social History  . Marital status: Married    Spouse name: N/A  . Number of children: N/A  . Years of education: N/A   Social History Main Topics  . Smoking status: Former Research scientist (life sciences)  . Smokeless tobacco: Never Used  . Alcohol use No  . Drug use: No  . Sexual activity: Not Currently   Other Topics Concern  . Not on file   Social History Narrative  . No narrative on file    Allergies:  Allergies  Allergen Reactions  . Buspar [Buspirone]   . Depakote [Divalproex Sodium]   . Klonopin [Clonazepam] Hives  . Nembutal [Pentobarbital Sodium]   . Neurontin [Gabapentin]   . Paxil [Paroxetine Hcl]   . Prozac [Fluoxetine Hcl]   . Risperdal [Risperidone]   . Topamax [Topiramate]   . Wellbutrin [Bupropion]   . Zoloft [Sertraline Hcl]   . Zyprexa [Olanzapine]     Metabolic Disorder Labs: Lab Results  Component Value Date   HGBA1C (H) 05/29/2009    6.9 (NOTE) The ADA recommends the following therapeutic goal for glycemic control related to Hgb A1c measurement: Goal of therapy: <6.5  Hgb A1c  Reference: American Diabetes Association: Clinical Practice Recommendations 2010, Diabetes Care, 2010, 33: (Suppl  1).   MPG 151 05/29/2009   No results found for: PROLACTIN Lab Results  Component Value Date   CHOL (H) 05/29/2009    201        ATP III CLASSIFICATION:  <200     mg/dL   Desirable  200-239  mg/dL   Borderline High  >=240    mg/dL   High          TRIG 648 (H) 05/29/2009   HDL 29 (L) 05/29/2009   CHOLHDL 6.9 05/29/2009   VLDL UNABLE TO CALCULATE IF TRIGLYCERIDE OVER 400 mg/dL 05/29/2009   LDLCALC  05/29/2009    UNABLE TO CALCULATE IF TRIGLYCERIDE OVER 400 mg/dL        Total  Cholesterol/HDL:CHD Risk Coronary Heart Disease Risk Table                     Men   Women  1/2 Average Risk   3.4   3.3  Average Risk       5.0   4.4  2 X Average Risk   9.6   7.1  3 X Average Risk  23.4   11.0        Use the calculated Patient Ratio above and the CHD Risk Table to determine the patient's CHD Risk.        ATP III CLASSIFICATION (LDL):  <100     mg/dL   Optimal  100-129  mg/dL   Near or Above                    Optimal  130-159  mg/dL   Borderline  160-189  mg/dL   High  >190     mg/dL   Very High     Current Medications: Current Outpatient Prescriptions  Medication Sig Dispense Refill  . amLODipine (NORVASC) 10 MG tablet Take 10 mg by mouth daily.    . carvedilol (COREG) 3.125 MG tablet     . fexofenadine-pseudoephedrine (ALLEGRA-D 24) 180-240 MG per 24 hr tablet     . glipiZIDE (GLUCOTROL) 5 MG tablet     . hydrOXYzine (ATARAX/VISTARIL) 10 MG tablet Take 1 tablet (10 mg total) by mouth daily. 90 tablet 0  . lamoTRIgine (LAMICTAL) 150 MG tablet Take 1 tablet (150 mg total) by mouth 2 (two) times daily. 180 tablet 0  . losartan (COZAAR) 100 MG tablet     . Melatonin 5 MG CAPS     . metFORMIN (GLUMETZA) 1000 MG (MOD) 24 hr tablet Take 1,000 mg by mouth daily with breakfast.    . mupirocin ointment (BACTROBAN) 2 %     . oxyCODONE (OXY IR/ROXICODONE) 5 MG immediate release tablet     . QUEtiapine (SEROQUEL) 50 MG tablet Take 1 tablet (50 mg total) by mouth at bedtime. 90 tablet 0  . spironolactone (ALDACTONE) 100 MG tablet Take 100 mg by mouth daily.    . traMADol (ULTRAM) 50 MG tablet     . triamcinolone cream (KENALOG) 0.1 %      No current facility-administered medications for this visit.     Neurologic: Headache: No Seizure: No Paresthesias: No  Musculoskeletal: Strength & Muscle Tone: decreased Gait & Station: unsteady Patient leans: Front, Backward and Uses wheelchair  Psychiatric Specialty Exam: ROS  Blood pressure 136/82, pulse 79, height  5\' 10"  (1.778 m), weight (!) 358 lb (162.4 kg).There is  no height or weight on file to calculate BMI.  General Appearance: Fairly Groomed and Obese  Eye Contact:  Fair  Speech:  Slow  Volume:  Normal  Mood:  Anxious  Affect:  Congruent  Thought Process:  Goal Directed  Orientation:  Full (Time, Place, and Person)  Thought Content: Logical   Suicidal Thoughts:  No  Homicidal Thoughts:  No  Memory:  Immediate;   Fair Recent;   Fair Remote;   Fair  Judgement:  Good  Insight:  Good  Psychomotor Activity:  Decreased  Concentration:  Concentration: Fair and Attention Span: Fair  Recall:  [employer] of Knowledge: Fair  Language: Good  Akathisia:  No  Handed:  Right  AIMS (if indicated):  0  Assets:  Communication Skills Desire for Improvement Housing Resilience Social Support  ADL's:  Intact  Cognition: WNL  Sleep:  Good     Assessment: Bipolar disorder type I.  Anxiety disorder NOS.  Plan: Patient is a stable on his current psychiatric medication.  Despite multiple health issues he is having his situation much better.  I will continue Lamictal 150 mg twice a day, Seroquel 50 mg at bedtime and he will continue Vistaril 10 mg as needed for anxiety.  Discussed medication side effects and benefits.  Recommended to call us back if he has any question or any concern.  Follow-up in 6 months.  ARFEEN,SYED T., MD 08/19/2017, 1:04 PM

## 2017-10-07 ENCOUNTER — Other Ambulatory Visit (HOSPITAL_COMMUNITY): Payer: Self-pay | Admitting: Psychiatry

## 2017-10-07 DIAGNOSIS — F3162 Bipolar disorder, current episode mixed, moderate: Secondary | ICD-10-CM

## 2017-10-10 ENCOUNTER — Telehealth (HOSPITAL_COMMUNITY): Payer: Self-pay

## 2017-10-10 ENCOUNTER — Other Ambulatory Visit (HOSPITAL_COMMUNITY): Payer: Self-pay | Admitting: Psychiatry

## 2017-10-10 DIAGNOSIS — F319 Bipolar disorder, unspecified: Secondary | ICD-10-CM

## 2017-10-10 NOTE — Telephone Encounter (Signed)
Too soon to refill.

## 2017-10-10 NOTE — Telephone Encounter (Signed)
Medication refill request - Fax received from [facility] for a refill of patient's prescribed Lamictal, last ordered 08/19/17 for 90 days and pt. does not retun until 03/03/18, 6 month follow-up.

## 2017-11-25 ENCOUNTER — Other Ambulatory Visit (HOSPITAL_COMMUNITY): Payer: Self-pay | Admitting: Psychiatry

## 2017-11-25 DIAGNOSIS — F3162 Bipolar disorder, current episode mixed, moderate: Secondary | ICD-10-CM

## 2017-11-25 DIAGNOSIS — F319 Bipolar disorder, unspecified: Secondary | ICD-10-CM

## 2017-12-03 ENCOUNTER — Other Ambulatory Visit (HOSPITAL_COMMUNITY): Payer: Self-pay

## 2017-12-03 DIAGNOSIS — F319 Bipolar disorder, unspecified: Secondary | ICD-10-CM

## 2017-12-03 DIAGNOSIS — F3162 Bipolar disorder, current episode mixed, moderate: Secondary | ICD-10-CM

## 2017-12-03 MED ORDER — HYDROXYZINE HCL 10 MG PO TABS: 10.0000 mg | ORAL_TABLET | Freq: Every day | ORAL | 0 refills | Status: DC

## 2017-12-03 MED ORDER — QUETIAPINE FUMARATE 50 MG PO TABS: 50.0000 mg | ORAL_TABLET | Freq: Every day | ORAL | 0 refills | Status: DC

## 2018-01-13 ENCOUNTER — Other Ambulatory Visit (HOSPITAL_COMMUNITY): Payer: Self-pay

## 2018-01-13 ENCOUNTER — Other Ambulatory Visit (HOSPITAL_COMMUNITY): Payer: Self-pay | Admitting: Psychiatry

## 2018-01-13 DIAGNOSIS — F3162 Bipolar disorder, current episode mixed, moderate: Secondary | ICD-10-CM

## 2018-01-13 DIAGNOSIS — F319 Bipolar disorder, unspecified: Secondary | ICD-10-CM

## 2018-01-13 MED ORDER — LAMOTRIGINE 150 MG PO TABS: 150.0000 mg | ORAL_TABLET | Freq: Two times a day (BID) | ORAL | 0 refills | Status: DC

## 2018-01-13 MED ORDER — HYDROXYZINE HCL 10 MG PO TABS: 10.0000 mg | ORAL_TABLET | Freq: Every day | ORAL | 0 refills | Status: DC

## 2018-01-13 MED ORDER — QUETIAPINE FUMARATE 50 MG PO TABS: 50.0000 mg | ORAL_TABLET | Freq: Every day | ORAL | 0 refills | Status: DC

## 2018-02-06 ENCOUNTER — Other Ambulatory Visit (HOSPITAL_COMMUNITY): Payer: Self-pay | Admitting: Psychiatry

## 2018-02-06 DIAGNOSIS — F319 Bipolar disorder, unspecified: Secondary | ICD-10-CM

## 2018-02-06 DIAGNOSIS — F3162 Bipolar disorder, current episode mixed, moderate: Secondary | ICD-10-CM

## 2018-03-03 ENCOUNTER — Ambulatory Visit (HOSPITAL_COMMUNITY): Payer: Medicare HMO | Admitting: Psychiatry

## 2018-03-03 ENCOUNTER — Encounter (HOSPITAL_COMMUNITY): Payer: Self-pay | Admitting: Psychiatry

## 2018-03-03 DIAGNOSIS — Z87891 Personal history of nicotine dependence: Secondary | ICD-10-CM

## 2018-03-03 DIAGNOSIS — F3162 Bipolar disorder, current episode mixed, moderate: Secondary | ICD-10-CM

## 2018-03-03 DIAGNOSIS — F419 Anxiety disorder, unspecified: Secondary | ICD-10-CM | POA: Diagnosis not present

## 2018-03-03 DIAGNOSIS — F319 Bipolar disorder, unspecified: Secondary | ICD-10-CM | POA: Diagnosis not present

## 2018-03-03 MED ORDER — QUETIAPINE FUMARATE 50 MG PO TABS: 50.0000 mg | ORAL_TABLET | Freq: Every day | ORAL | 0 refills | Status: DC

## 2018-03-03 MED ORDER — LAMOTRIGINE 150 MG PO TABS: 150.0000 mg | ORAL_TABLET | Freq: Two times a day (BID) | ORAL | 0 refills | Status: DC

## 2018-03-03 NOTE — Progress Notes (Signed)
BH MD/PA/NP OP Progress Note  03/03/2018 1:24 PM [name]  MRN:  [id number]  Chief Complaint: I am feeling better.  Start walking few steps.  HPI: Patient came for his follow-up appointment with his wife who is also a patient in this office.  He was last seen in August 2018.  He is feeling better because he started walking on his own for few steps.  He has multiple surgeries but overall he feels much better.  He still uses most of the time wheelchair.  He is sleeping good.  He denies any irritability, anger, mania, psychosis or any hallucination.  He denies any suicidal thoughts or homicidal thought.  He had a good spirits and is looking forward for a complete recovery.  He is not drinking or using any illegal substances.  He is compliant with Lamictal Seroquel and sometimes he takes Vistaril 10 mg as needed.  He has no rash, itching, tremors or shakes.  Recently seen his primary care physician and his hemoglobin A1c was 7 but he is watching his calorie intake very closely.  Patient does not want to change his medication.  Visit Diagnosis:    ICD-10-CM   1. Bipolar 1 disorder (HCC) F31.9 QUEtiapine (SEROQUEL) 50 MG tablet  2. Bipolar 1 disorder, mixed, moderate (HCC) F31.62 lamoTRIgine (LAMICTAL) 150 MG tablet    Past Psychiatric History: Viewed. Patient has psychiatric illness since 1998. He has 2 psychiatric hospitalization. He denies any history of suicidal attempt but admitted history of suicidal thinking, paranoia, hallucination and severe depression and aggressive behavior. In the past he had tried Depakote, Klonopin, Topamax, Zoloft, Wellbutrin, Neurontin, Zyprexa, Prozac, Paxil, Geodon, Risperdal, BuSpar, lithium with limited response.  Past Medical History:  Past Medical History:  Diagnosis Date  . Diabetes mellitus, type II ([place])   . GERD (gastroesophageal reflux disease)   . HTN (hypertension)   . Obesity   . 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 CHOLECYSTECTOMY    . HERNIA REPAIR    . SKIN GRAFT FULL THICKNESS LEG      Family Psychiatric History: Reviewed.  Family History: History reviewed. No pertinent family history.  Social History:  Social History   Socioeconomic History  . Marital status: Married    Spouse name: None  . Number of children: None  . Years of education: None  . Highest education level: None  Social Needs  . Financial resource strain: None  . Food insecurity - worry: None  . Food insecurity - inability: None  . Transportation needs - medical: None  . Transportation needs - non-medical: None  Occupational History  . None  Tobacco Use  . Smoking status: Former Research scientist (life sciences)  . Smokeless tobacco: Never Used  Substance and Sexual Activity  . Alcohol use: No    Alcohol/week: 0.0 oz  . Drug use: No  . Sexual activity: Not Currently  Other Topics Concern  . None  Social History Narrative  . None    Allergies:  Allergies  Allergen Reactions  . Temazepam Nausea Only and Anxiety    Other reaction(s): Agitation, Confusion, Dizziness, Hallucinations  . Ziprasidone     Other reaction(s): Hallucinations  . Buspar [Buspirone]   . Depakote [Divalproex Sodium]   . Klonopin [Clonazepam] Hives  . Nembutal [Pentobarbital Sodium]   . Neurontin [Gabapentin]   . Paxil [Paroxetine Hcl]   . Prozac [Fluoxetine Hcl]   . Risperdal [Risperidone]   . Topamax [Topiramate]   .  Wellbutrin [Bupropion]   . Zoloft [Sertraline Hcl]   . Zyprexa [Olanzapine]   . Lithium Diarrhea    HALLUCINATIONS      Metabolic Disorder Labs: Lab Results  Component Value Date   HGBA1C (H) 05/29/2009    6.9 (NOTE) The ADA recommends the following therapeutic goal for glycemic control related to Hgb A1c measurement: Goal of therapy: <6.5 Hgb A1c  Reference: American Diabetes Association: Clinical Practice Recommendations 2010, Diabetes Care, 2010, 33: (Suppl  1).   MPG 151 05/29/2009   No results found for:  PROLACTIN Lab Results  Component Value Date   CHOL (H) 05/29/2009    201        ATP III CLASSIFICATION:  <200     mg/dL   Desirable  200-239  mg/dL   Borderline High  >=240    mg/dL   High          TRIG 648 (H) 05/29/2009   HDL 29 (L) 05/29/2009   CHOLHDL 6.9 05/29/2009   VLDL UNABLE TO CALCULATE IF TRIGLYCERIDE OVER 400 mg/dL 05/29/2009   LDLCALC  05/29/2009    UNABLE TO CALCULATE IF TRIGLYCERIDE OVER 400 mg/dL        Total Cholesterol/HDL:CHD Risk Coronary Heart Disease Risk Table                     Men   Women  1/2 Average Risk   3.4   3.3  Average Risk       5.0   4.4  2 X Average Risk   9.6   7.1  3 X Average Risk  23.4   11.0        Use the calculated Patient Ratio above and the CHD Risk Table to determine the patient's CHD Risk.        ATP III CLASSIFICATION (LDL):  <100     mg/dL   Optimal  100-129  mg/dL   Near or Above                    Optimal  130-159  mg/dL   Borderline  160-189  mg/dL   High  >190     mg/dL   Very High   No results found for: TSH  Therapeutic Level Labs: No results found for: LITHIUM No results found for: VALPROATE No components found for:  CBMZ  Current Medications: Current Outpatient Medications  Medication Sig Dispense Refill  . amLODipine (NORVASC) 10 MG tablet Take 10 mg by mouth daily.    . Ascorbic Acid (VITAMIN C) 1000 MG tablet Take by mouth.    Marland Kitchen b complex vitamins tablet Take by mouth.    . carvedilol (COREG) 3.125 MG tablet     . Coenzyme Q10 100 MG capsule     . fexofenadine-pseudoephedrine (ALLEGRA-D 24) 180-240 MG per 24 hr tablet     . glipiZIDE (GLUCOTROL) 5 MG tablet     . hydrOXYzine (ATARAX/VISTARIL) 10 MG tablet Take 1 tablet (10 mg total) by mouth daily. 90 tablet 0  . lamoTRIgine (LAMICTAL) 150 MG tablet Take 1 tablet (150 mg total) by mouth 2 (two) times daily. 180 tablet 0  . losartan (COZAAR) 100 MG tablet     . Melatonin 5 MG CAPS     . metFORMIN (GLUMETZA) 1000 MG (MOD) 24 hr tablet Take 1,000 mg by  mouth daily with breakfast.    . milk thistle 175 MG tablet Take 175 mg by mouth daily.    Marland Kitchen  Multiple Vitamins-Minerals (MULTIVITAMIN WITH MINERALS) tablet Take 1 tablet by mouth daily.    . mupirocin ointment (BACTROBAN) 2 %     . QUEtiapine (SEROQUEL) 50 MG tablet Take 1 tablet (50 mg total) by mouth at bedtime. 90 tablet 0  . spironolactone (ALDACTONE) 100 MG tablet Take 100 mg by mouth daily.    . traMADol (ULTRAM) 50 MG tablet     . triamcinolone cream (KENALOG) 0.1 %     . zinc sulfate 220 (50 Zn) MG capsule Take by mouth.     No current facility-administered medications for this visit.      Musculoskeletal: Strength & Muscle Tone: decreased Gait & Station: unsteady Patient leans: N/A  Psychiatric Specialty Exam: ROS  Blood pressure 138/80, pulse 92, height 5\' 10"  (1.778 m), weight (!) 347 lb (157.4 kg).Body mass index is 49.79 kg/m.  General Appearance: Casual and Obese  Eye Contact:  Good  Speech:  Clear and Coherent  Volume:  Normal  Mood:  Anxious  Affect:  Appropriate  Thought Process:  Goal Directed  Orientation:  Full (Time, Place, and Person)  Thought Content: Logical   Suicidal Thoughts:  No  Homicidal Thoughts:  No  Memory:  Immediate;   Fair Recent;   Fair Remote;   Fair  Judgement:  Good  Insight:  Good  Psychomotor Activity:  Normal  Concentration:  Concentration: Fair and Attention Span: Fair  Recall:  [employer] of Knowledge: Good  Language: Good  Akathisia:  No  Handed:  Right  AIMS (if indicated): not done  Assets:  Communication Skills Desire for Improvement Housing Resilience  ADL's:  Intact  Cognition: WNL  Sleep:  Good   Screenings:   Assessment and Plan: Bipolar disorder type I.  Anxiety disorder NOS.  Patient is a stable on his current psychiatric medication.  I reviewed blood work results from his primary care physician.  Discussed medication side effects and benefits.  Patient has no rash, itching, tremors or shakes.  Continue  Lamictal 150 mg twice a day and Seroquel 50 mg at bedtime.  He is taking Vistaril 10 mg as needed for anxiety and does not need a new prescription at this time.  Recommended to call us back if is any question or any concern.  Follow-up in 6 months.   Kathlee Nations, MD 03/03/2018, 1:25 PM

## 2018-04-27 DIAGNOSIS — R809 Proteinuria, unspecified: Secondary | ICD-10-CM | POA: Insufficient documentation

## 2018-04-27 DIAGNOSIS — E1129 Type 2 diabetes mellitus with other diabetic kidney complication: Secondary | ICD-10-CM | POA: Insufficient documentation

## 2018-09-03 ENCOUNTER — Encounter (HOSPITAL_COMMUNITY): Payer: Self-pay | Admitting: Psychiatry

## 2018-09-03 ENCOUNTER — Ambulatory Visit (HOSPITAL_COMMUNITY): Payer: Medicare HMO | Admitting: Psychiatry

## 2018-09-03 VITALS — BP 127/77 | HR 94 | Ht 70.0 in | Wt 349.4 lb

## 2018-09-03 DIAGNOSIS — Z87891 Personal history of nicotine dependence: Secondary | ICD-10-CM

## 2018-09-03 DIAGNOSIS — G4733 Obstructive sleep apnea (adult) (pediatric): Secondary | ICD-10-CM | POA: Diagnosis not present

## 2018-09-03 DIAGNOSIS — F411 Generalized anxiety disorder: Secondary | ICD-10-CM

## 2018-09-03 DIAGNOSIS — F3162 Bipolar disorder, current episode mixed, moderate: Secondary | ICD-10-CM

## 2018-09-03 DIAGNOSIS — F319 Bipolar disorder, unspecified: Secondary | ICD-10-CM | POA: Diagnosis not present

## 2018-09-03 DIAGNOSIS — Z79899 Other long term (current) drug therapy: Secondary | ICD-10-CM

## 2018-09-03 MED ORDER — LAMOTRIGINE 150 MG PO TABS: 150.0000 mg | ORAL_TABLET | Freq: Two times a day (BID) | ORAL | 1 refills | Status: DC

## 2018-09-03 MED ORDER — QUETIAPINE FUMARATE 50 MG PO TABS: 50.0000 mg | ORAL_TABLET | Freq: Every day | ORAL | 1 refills | Status: DC

## 2018-09-03 MED ORDER — HYDROXYZINE HCL 10 MG PO TABS: 10.0000 mg | ORAL_TABLET | Freq: Every day | ORAL | 1 refills | Status: DC

## 2018-09-03 NOTE — Progress Notes (Signed)
[place] MD/PA/NP OP Progress Note  09/03/2018 1:50 PM [name]  MRN:  [id number]  Chief Complaint: I get stressed out now and then, but I know how to handle it quite well.   HPI: Patient came for his follow-up appointment with his wife who is also a patient in this office.  He was last seen in March 2019.  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  He uses computer for coordination and memory.  He is able to take the garbage cans in and get the mail. He has "been fighting to keep his legs x 3 years."  He sees Dr. Olen Pel, PCP next visit in October.  Labs have been within acceptable range, and wife reports A1c "good".  He is using CPAP reliably for OSA.  He has not needed the wheel chair since last visit.  Medications working well, has used Vistaril 10 mg PRN about 10 times a month.   He is sleeping good.  He denies any irritability, anger, mania, psychosis or any hallucination.  He denies any suicidal thoughts or homicidal thought.  He had a good spirits and is looking forward for a complete recovery and continued weight loss. He is no longer drinking sodas. He is not drinking or using any illegal substances.  He is compliant with Lamictal Seroquel and sometimes he takes Vistaril 10 mg as needed.  He has no rash, itching, tremors or shakes.  Patient does not want to change his medication.    Visit Diagnosis:    ICD-10-CM   1. Bipolar 1 disorder (HCC) F31.9 QUEtiapine (SEROQUEL) 50 MG tablet  2. Generalized anxiety disorder F41.1   3. Obstructive sleep apnea syndrome G47.33   4. Medication management Z79.899   5. Bipolar 1 disorder, mixed, moderate (HCC) F31.62 hydrOXYzine (ATARAX/VISTARIL) 10 MG tablet    lamoTRIgine (LAMICTAL) 150 MG tablet    Past Psychiatric History: Viewed. Patient has psychiatric illness since 1998. He has 2 psychiatric hospitalization, last in 2000. He denies any history of suicidal attempt but admitted history of suicidal  thinking, paranoia, hallucination and severe depression and aggressive behavior. In the past he had tried Depakote, Klonopin, Topamax, Zoloft, Wellbutrin, Neurontin, Zyprexa, Prozac, Paxil, Geodon, Risperdal, BuSpar, lithium with limited response.  Past Medical History:  Past Medical History:  Diagnosis Date  . Diabetes mellitus, type II ([place])   . GERD (gastroesophageal reflux disease)   . HTN (hypertension)   . Obesity   . 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 CHOLECYSTECTOMY    . HERNIA REPAIR    . SKIN GRAFT FULL THICKNESS LEG      Family Psychiatric History: Reviewed.  Family History: No family history on file.  Social History:   Lives with (2nd) wife of 23 years, no children  Social History   Socioeconomic History  . Marital status: Married    Spouse name: Not on file  . Number of children: Not on file  . Years of education: Not on file  . Highest education level: Not on file  Occupational History  . Not on file  Social Needs  . Financial resource strain: Not on file  . Food insecurity:    Worry: Not on file    Inability: Not on file  . Transportation needs:    Medical: Not on file    Non-medical: Not on file  Tobacco Use  . Smoking status: Former Research scientist (life sciences)  .  Smokeless tobacco: Never Used  Substance and Sexual Activity  . Alcohol use: No    Alcohol/week: 0.0 standard drinks  . Drug use: No  . Sexual activity: Not Currently  Lifestyle  . Physical activity:    Days per week: Not on file    Minutes per session: Not on file  . Stress: Not on file  Relationships  . Social connections:    Talks on phone: Not on file    Gets together: Not on file    Attends religious service: Not on file    Active member of club or organization: Not on file    Attends meetings of clubs or organizations: Not on file    Relationship status: Not on file  Other Topics Concern  . Not on file  Social History Narrative  . Not on file     Allergies:  Allergies  Allergen Reactions  . Blue Dyes (Parenteral) Rash  . Normal Saline [Sodium Chloride] Rash  . Temazepam Nausea Only and Anxiety    Other reaction(s): Agitation, Confusion, Dizziness, Hallucinations  . Ziprasidone     Other reaction(s): Hallucinations  . Buspar [Buspirone]   . Depakote [Divalproex Sodium]   . Klonopin [Clonazepam] Hives  . Nembutal [Pentobarbital Sodium]   . Neurontin [Gabapentin]   . Paxil [Paroxetine Hcl]   . Prozac [Fluoxetine Hcl]   . Risperdal [Risperidone]   . Topamax [Topiramate]   . Wellbutrin [Bupropion]   . Zoloft [Sertraline Hcl]   . Zyprexa [Olanzapine]   . Lithium Diarrhea    HALLUCINATIONS      Metabolic Disorder Labs: Lab Results  Component Value Date   HGBA1C (H) 05/29/2009    6.9 (NOTE) The ADA recommends the following therapeutic goal for glycemic control related to Hgb A1c measurement: Goal of therapy: <6.5 Hgb A1c  Reference: American Diabetes Association: Clinical Practice Recommendations 2010, Diabetes Care, 2010, 33: (Suppl  1).   MPG 151 05/29/2009   No results found for: PROLACTIN Lab Results  Component Value Date   CHOL (H) 05/29/2009    201        ATP III CLASSIFICATION:  <200     mg/dL   Desirable  200-239  mg/dL   Borderline High  >=240    mg/dL   High          TRIG 648 (H) 05/29/2009   HDL 29 (L) 05/29/2009   CHOLHDL 6.9 05/29/2009   VLDL UNABLE TO CALCULATE IF TRIGLYCERIDE OVER 400 mg/dL 05/29/2009   LDLCALC  05/29/2009    UNABLE TO CALCULATE IF TRIGLYCERIDE OVER 400 mg/dL        Total Cholesterol/HDL:CHD Risk Coronary Heart Disease Risk Table                     Men   Women  1/2 Average Risk   3.4   3.3  Average Risk       5.0   4.4  2 X Average Risk   9.6   7.1  3 X Average Risk  23.4   11.0        Use the calculated Patient Ratio above and the CHD Risk Table to determine the patient's CHD Risk.        ATP III CLASSIFICATION (LDL):  <100     mg/dL   Optimal  100-129  mg/dL    Near or Above  Optimal  130-159  mg/dL   Borderline  160-189  mg/dL   High  >190     mg/dL   Very High   No results found for: TSH  Therapeutic Level Labs: No results found for: LITHIUM No results found for: VALPROATE No components found for:  CBMZ  Current Medications: Current Outpatient Medications  Medication Sig Dispense Refill  . amLODipine (NORVASC) 10 MG tablet Take 10 mg by mouth daily.    . Ascorbic Acid (VITAMIN C) 1000 MG tablet Take by mouth.    Marland Kitchen b complex vitamins tablet Take by mouth.    . carvedilol (COREG) 3.125 MG tablet     . Coenzyme Q10 100 MG capsule     . fexofenadine-pseudoephedrine (ALLEGRA-D 24) 180-240 MG per 24 hr tablet     . glipiZIDE (GLUCOTROL) 5 MG tablet     . hydrOXYzine (ATARAX/VISTARIL) 10 MG tablet Take 1 tablet (10 mg total) by mouth daily. 90 tablet 0  . lamoTRIgine (LAMICTAL) 150 MG tablet Take 1 tablet (150 mg total) by mouth 2 (two) times daily. 180 tablet 0  . losartan (COZAAR) 100 MG tablet     . Melatonin 5 MG CAPS     . metFORMIN (GLUMETZA) 1000 MG (MOD) 24 hr tablet Take 1,000 mg by mouth daily with breakfast.    . milk thistle 175 MG tablet Take 175 mg by mouth daily.    . Multiple Vitamins-Minerals (MULTIVITAMIN WITH MINERALS) tablet Take 1 tablet by mouth daily.    . mupirocin ointment (BACTROBAN) 2 %     . QUEtiapine (SEROQUEL) 50 MG tablet Take 1 tablet (50 mg total) by mouth at bedtime. 90 tablet 0  . spironolactone (ALDACTONE) 100 MG tablet Take 100 mg by mouth daily.    . traMADol (ULTRAM) 50 MG tablet     . triamcinolone cream (KENALOG) 0.1 %     . zinc sulfate 220 (50 Zn) MG capsule Take by mouth.     No current facility-administered medications for this visit.      Musculoskeletal: Strength & Muscle Tone: decreased Gait & Station: unsteady Patient leans: N/A  Psychiatric Specialty Exam: Review of Systems  Constitutional: Positive for weight loss (10 pounds, with intentional diet changes).  Negative for malaise/fatigue.  Respiratory: Negative.   Cardiovascular: Negative.   Gastrointestinal: Negative.   Musculoskeletal: Negative.   Neurological: Negative for focal weakness and headaches.  Psychiatric/Behavioral: Negative for depression, hallucinations, memory loss, substance abuse and suicidal ideas. The patient is not nervous/anxious and does not have insomnia (OSA on CPAP).     Blood pressure 127/77, pulse 94, height 5\' 10"  (1.778 m), weight (!) 349 lb 6.4 oz (158.5 kg), SpO2 96 %.Body mass index is 50.13 kg/m.  General Appearance: Casual and Obese  Eye Contact:  Good  Speech:  Clear and Coherent, Normal Rate and talkative  Volume:  Normal  Mood:  Euthymic  Affect:  Appropriate and Congruent  Thought Process:  Coherent, Goal Directed and Descriptions of Associations: Tangential  Orientation:  Full (Time, Place, and Person)  Thought Content: Logical and Hallucinations: denies recent   Suicidal Thoughts:  No  Homicidal Thoughts:  No  Memory:  Immediate;   Fair Recent;   Fair Remote;   Fair  Judgement:  Good  Insight:  Good  Psychomotor Activity:  Normal  Concentration:  Concentration: Fair and Attention Span: Fair  Recall:  [employer] of Knowledge: Good  Language: Good  Akathisia:  No  Handed:  Right  AIMS (if indicated): not done  Assets:  Communication Skills Desire for Improvement Housing Resilience  ADL's:  Intact  Cognition: WNL  Sleep:  Good   Screenings: n/a   Assessment and Plan: Bipolar disorder type I.  Anxiety disorder NOS. OSA  Patient is a stable on his current psychiatric medication.  He is due next month for blood work results from his primary care physician.  Discussed medication side effects and benefits.  Patient has no rash, itching, tremors or shakes.  Continue Lamictal 150 mg twice a day and Seroquel 50 mg at bedtime.  He is taking Vistaril 10 mg as needed for anxiety and does not need a new prescription at this time.  Recommended to  call us back if is any question or any concern.  Time spent 25 minutes.  More than 50% of the time spent in medication education, psychoeducation, counseling and coordination of care.  Discuss safety plan that anytime having active suicidal thoughts or homicidal thoughts then patient need to call 911 or go to the local emergency room.   Follow-up in 6 months.   Lavella Hammock, MD 09/03/2018, 1:50 PM

## 2019-03-04 ENCOUNTER — Ambulatory Visit (HOSPITAL_COMMUNITY): Payer: Self-pay | Admitting: Psychiatry

## 2019-04-01 ENCOUNTER — Other Ambulatory Visit: Payer: Self-pay

## 2019-04-01 ENCOUNTER — Ambulatory Visit (INDEPENDENT_AMBULATORY_CARE_PROVIDER_SITE_OTHER): Payer: Medicare HMO | Admitting: Psychiatry

## 2019-04-01 DIAGNOSIS — F411 Generalized anxiety disorder: Secondary | ICD-10-CM

## 2019-04-01 DIAGNOSIS — F319 Bipolar disorder, unspecified: Secondary | ICD-10-CM

## 2019-04-01 MED ORDER — LAMOTRIGINE 150 MG PO TABS: 150.0000 mg | ORAL_TABLET | Freq: Two times a day (BID) | ORAL | 1 refills | Status: DC

## 2019-04-01 MED ORDER — HYDROXYZINE HCL 10 MG PO TABS: 10.0000 mg | ORAL_TABLET | Freq: Every day | ORAL | 1 refills | Status: DC

## 2019-04-01 MED ORDER — QUETIAPINE FUMARATE 50 MG PO TABS: 50.0000 mg | ORAL_TABLET | Freq: Every day | ORAL | 1 refills | Status: DC

## 2019-04-01 NOTE — Progress Notes (Signed)
Virtual Visit via Telephone Note  I connected with [name] on 04/01/19 at  2:20 PM EDT by telephone and verified that I am speaking with the correct person using two identifiers.   I discussed the limitations, risks, security and privacy concerns of performing an evaluation and management service by telephone and the availability of in person appointments. I also discussed with the patient that there may be a patient responsible charge related to this service. The patient expressed understanding and agreed to proceed.   History of Present Illness: Patient was evaluated through phone conversation.  He was last seen by covering physician who did not change his medication.  He is doing better on his medication.  His leg swelling and pain is much better and he started walking without any support.  He also reported lost weight and his energy level is improved.  He is sleeping good.  He is going to his chart despite pandemic coronavirus.  Patient told his church is very small and it is safe for him to go.  He usually try to stay home and does not leave unless it is important.  He takes hydroxyzine as needed.  He also taking Lamictal and Seroquel.  He denies any rash or any itching.  He feel that he is blessed because his physical health his much improved and he can now ambulate because his leg swelling is improved.  He reported good spirits.  He denies any major anxiety or nervousness.  He is not drinking or using any illegal substances.  He like to continue his Lamictal, Seroquel and hydroxyzine as needed.  His appetite is okay.  His energy level is improved from the past.  Past Psychiatric History: Viewed. H/O psychiatric illness since 4. H/O 2 psychiatric hospitalization. No h/o suicidal attempt but admitted h/o suicidal thinking, paranoia, hallucination and severe depression and aggressive behavior. Tried Depakote, Klonopin, Topamax, Zoloft, Wellbutrin, Neurontin, Zyprexa, Prozac, Paxil,  Geodon, Risperdal, BuSpar, lithium with limited response.   Observations/Objective: Limited mental status examination done on the phone.  Patient describes his mood is okay.  His speech is slow but clear and coherent.  Denies any auditory or visual hallucination.  He denies any paranoia, grandiosity, delusions or any active or passive suicidal thoughts.  He reported no tremors, shakes, itching or shakes.  He is alert and oriented x3.  His thought process is slow but logical.  There were no flight of ideas or any loose association.  His fund of knowledge is average.  He does not appear to be distracted on the phone.  His insight judgment is okay.  Assessment and Plan: Bipolar disorder type I.  Generalized anxiety disorder.  Patient is a stable on his current medication.  He does not want to change his current regimen.  Continue Lamictal 150 mg twice a day, Seroquel 50 mg at bedtime and hydroxyzine 10 mg as needed for anxiety.  I recommended to call us back if is any question or any concern.  Follow-up in 6 months.  Follow Up Instructions:    I discussed the assessment and treatment plan with the patient. The patient was provided an opportunity to ask questions and all were answered. The patient agreed with the plan and demonstrated an understanding of the instructions.   The patient was advised to call back or seek an in-person evaluation if the symptoms worsen or if the condition fails to improve as anticipated.  I provided 15 minutes of non-face-to-face time during this encounter.  Kathlee Nations, MD

## 2019-04-15 DIAGNOSIS — S81802A Unspecified open wound, left lower leg, initial encounter: Secondary | ICD-10-CM | POA: Insufficient documentation

## 2019-04-16 DIAGNOSIS — I83893 Varicose veins of bilateral lower extremities with other complications: Secondary | ICD-10-CM | POA: Insufficient documentation

## 2019-08-06 ENCOUNTER — Other Ambulatory Visit (HOSPITAL_COMMUNITY): Payer: Self-pay | Admitting: Psychiatry

## 2019-08-06 DIAGNOSIS — F319 Bipolar disorder, unspecified: Secondary | ICD-10-CM

## 2019-08-06 DIAGNOSIS — F411 Generalized anxiety disorder: Secondary | ICD-10-CM

## 2019-09-02 ENCOUNTER — Ambulatory Visit (HOSPITAL_COMMUNITY): Payer: Medicare HMO | Admitting: Psychiatry

## 2019-09-14 ENCOUNTER — Ambulatory Visit (HOSPITAL_COMMUNITY): Payer: Medicare HMO | Admitting: Psychiatry

## 2019-10-15 ENCOUNTER — Other Ambulatory Visit: Payer: Self-pay

## 2019-10-15 ENCOUNTER — Encounter (HOSPITAL_COMMUNITY): Payer: Self-pay | Admitting: Psychiatry

## 2019-10-15 ENCOUNTER — Ambulatory Visit (INDEPENDENT_AMBULATORY_CARE_PROVIDER_SITE_OTHER): Payer: Medicare HMO | Admitting: Psychiatry

## 2019-10-15 DIAGNOSIS — F319 Bipolar disorder, unspecified: Secondary | ICD-10-CM | POA: Diagnosis not present

## 2019-10-15 DIAGNOSIS — F411 Generalized anxiety disorder: Secondary | ICD-10-CM | POA: Diagnosis not present

## 2019-10-15 MED ORDER — LAMOTRIGINE 150 MG PO TABS: 300.0000 mg | ORAL_TABLET | Freq: Every day | ORAL | 1 refills | Status: DC

## 2019-10-15 MED ORDER — QUETIAPINE FUMARATE 50 MG PO TABS: 50.0000 mg | ORAL_TABLET | Freq: Every day | ORAL | 1 refills | Status: DC

## 2019-10-15 MED ORDER — HYDROXYZINE HCL 10 MG PO TABS: 10.0000 mg | ORAL_TABLET | Freq: Every day | ORAL | 1 refills | Status: DC

## 2019-10-15 NOTE — Progress Notes (Signed)
Virtual Visit via Telephone Note  I connected with [name] on 10/15/19 at  1:00 PM EDT by telephone and verified that I am speaking with the correct person using two identifiers.   I discussed the limitations, risks, security and privacy concerns of performing an evaluation and management service by telephone and the availability of in person appointments. I also discussed with the patient that there may be a patient responsible charge related to this service. The patient expressed understanding and agreed to proceed.   History of Present Illness: Patient was evaluated by phone session.  He is actually doing very well physically.  He is start walking every day and able to lose weight.  He has lost 15 pounds in past few months.  He has a still open wound but it is healing slowly and gradually.  His wife is very supportive and helpful.  Patient denies any irritability, anger or any severe mood swings.  He takes melatonin which helps his sleep.  He also takes CPAP and rarely takes hydroxyzine when he is very anxious.  He is compliant with Seroquel and Lamictal as prescribed.  He has no rash, itching, tremors or shakes.  Denies any hallucination, paranoia or any suicidal thoughts.  He like to continue his current medication.  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  Patient feels proud that he is helping his wife at church.  Patient like to continue current medication.  Denies drinking or using any illegal substances.  He denies any major panic attack.   Past Psychiatric History:Viewed. H/O psychiatric illness since 72. H/O 2 psychiatric hospitalization. No h/o suicidal attempt but admitted h/o suicidal thinking, paranoia, hallucination and severe depression and aggressive behavior. Tried Depakote, Klonopin, Topamax, Zoloft, Wellbutrin, Neurontin, Zyprexa, Prozac, Paxil, Geodon, Risperdal, BuSpar, lithium with limited response.   Psychiatric  Specialty Exam: Physical Exam  ROS  There were no vitals taken for this visit.There is no height or weight on file to calculate BMI.  General Appearance: NA  Eye Contact:  NA  Speech:  Clear and Coherent and Slow  Volume:  Normal  Mood:  Anxious  Affect:  NA  Thought Process:  Descriptions of Associations: Intact  Orientation:  Full (Time, Place, and Person)  Thought Content:  Logical  Suicidal Thoughts:  No  Homicidal Thoughts:  No  Memory:  Immediate;   Fair Recent;   Fair Remote;   Fair  Judgement:  Good  Insight:  Present  Psychomotor Activity:  NA  Concentration:  Concentration: Fair and Attention Span: Fair  Recall:  [employer] of Knowledge:  Fair  Language:  Good  Akathisia:  No  Handed:  Right  AIMS (if indicated):     Assets:  Communication Skills Desire for Improvement Housing Social Support  ADL's:  Intact  Cognition:  WNL  Sleep:   fair      Assessment and Plan: Bipolar disorder type I.  Generalized anxiety disorder.  Patient is a stable on his current medication.  Continue Lamictal 3 mg daily, Seroquel 50 mg at bedtime and hydroxyzine 10 mg as needed for severe anxiety.  Discussed medication side effects and benefits.  Recommended to call us back if is any question of any concern.  Follow-up in 6 months.  Follow Up Instructions:    I discussed the assessment and treatment plan with the patient. The patient was provided an opportunity to ask questions and all were answered.  The patient agreed with the plan and demonstrated an understanding of the instructions.   The patient was advised to call back or seek an in-person evaluation if the symptoms worsen or if the condition fails to improve as anticipated.  I provided 20 minutes of non-face-to-face time during this encounter.   Kathlee Nations, MD

## 2020-03-14 ENCOUNTER — Other Ambulatory Visit (HOSPITAL_COMMUNITY): Payer: Self-pay | Admitting: Psychiatry

## 2020-03-14 DIAGNOSIS — F411 Generalized anxiety disorder: Secondary | ICD-10-CM

## 2020-03-14 DIAGNOSIS — F319 Bipolar disorder, unspecified: Secondary | ICD-10-CM

## 2020-04-12 ENCOUNTER — Other Ambulatory Visit (HOSPITAL_COMMUNITY): Payer: Self-pay | Admitting: Psychiatry

## 2020-04-12 DIAGNOSIS — F411 Generalized anxiety disorder: Secondary | ICD-10-CM

## 2020-04-14 ENCOUNTER — Other Ambulatory Visit: Payer: Self-pay

## 2020-04-14 ENCOUNTER — Encounter (HOSPITAL_COMMUNITY): Payer: Self-pay | Admitting: Psychiatry

## 2020-04-14 ENCOUNTER — Ambulatory Visit (INDEPENDENT_AMBULATORY_CARE_PROVIDER_SITE_OTHER): Payer: Medicare HMO | Admitting: Psychiatry

## 2020-04-14 DIAGNOSIS — F319 Bipolar disorder, unspecified: Secondary | ICD-10-CM | POA: Diagnosis not present

## 2020-04-14 DIAGNOSIS — F411 Generalized anxiety disorder: Secondary | ICD-10-CM

## 2020-04-14 MED ORDER — QUETIAPINE FUMARATE 50 MG PO TABS: 50.0000 mg | ORAL_TABLET | Freq: Every day | ORAL | 1 refills | Status: AC

## 2020-04-14 MED ORDER — LAMOTRIGINE 150 MG PO TABS: 300.0000 mg | ORAL_TABLET | Freq: Every day | ORAL | 1 refills | Status: AC

## 2020-04-14 MED ORDER — HYDROXYZINE HCL 10 MG PO TABS: 10.0000 mg | ORAL_TABLET | Freq: Every day | ORAL | 1 refills | Status: AC

## 2020-04-14 NOTE — Progress Notes (Signed)
Virtual Visit via Telephone Note  I connected with [name] on 04/14/20 at  1:00 PM EDT by telephone and verified that I am speaking with the correct person using two identifiers.   I discussed the limitations, risks, security and privacy concerns of performing an evaluation and management service by telephone and the availability of in person appointments. I also discussed with the patient that there may be a patient responsible charge related to this service. The patient expressed understanding and agreed to proceed.   History of Present Illness: Patient was evaluated by phone session.  He is physically doing very well.  He lost another 10 pounds and more active.  He is walking much better.  He is able to do all his ADLs.  He is sleeping good with the help of melatonin.  He does not use a CPAP as he chooses to sleep on recliner and he sleeps better on recliner.  He had a good support from his wife who is also a patient in this office.  He denies any irritability, anger, mania or any severe mood swings.  He denies any crying spells or any suicidal thoughts.  He has no tremors shakes or any EPS.  Like to keep his current medication.   Past Psychiatric History:Viewed. H/Opsychiatric illness since 1998. H/O2 psychiatric hospitalization. No h/osuicidal attempt but admitted h/osuicidal thinking, paranoia, hallucination and severe depression and aggressive behavior. TriedDepakote, Klonopin, Topamax, Zoloft, Wellbutrin, Neurontin, Zyprexa, Prozac, Paxil, Geodon, Risperdal, BuSpar, lithium with limited response.  Psychiatric Specialty Exam: Physical Exam  Review of Systems  There were no vitals taken for this visit.There is no height or weight on file to calculate BMI.  General Appearance: NA  Eye Contact:  NA  Speech:  Slow  Volume:  Normal  Mood:  Euthymic  Affect:  NA  Thought Process:  Descriptions of Associations: Intact  Orientation:  Full (Time, Place, and Person)  Thought  Content:  WDL  Suicidal Thoughts:  No  Homicidal Thoughts:  No  Memory:  Immediate;   Good Recent;   Fair Remote;   Fair  Judgement:  Intact  Insight:  Present  Psychomotor Activity:  NA  Concentration:  Concentration: Fair and Attention Span: Fair  Recall:  [employer] of Knowledge:  Good  Language:  Good  Akathisia:  No  Handed:  Right  AIMS (if indicated):     Assets:  Communication Skills Desire for Improvement Housing Resilience Social Support  ADL's:  Intact  Cognition:  WNL  Sleep:   good . Not using CPAP      Assessment and Plan: Bipolar disorder type I.  Generalized anxiety disorder.  Patient doing well on his current medication.  He has no rash, itching tremors or shakes.  Continue Lamictal 300 mg daily, Seroquel 50 mg at bedtime and hydroxyzine 10 mg daily to help with anxiety.  Discussed medication side effects and benefits.  Recommended to call us back if is any question of any concern.  Follow-up in 6 months.  Follow Up Instructions:    I discussed the assessment and treatment plan with the patient. The patient was provided an opportunity to ask questions and all were answered. The patient agreed with the plan and demonstrated an understanding of the instructions.   The patient was advised to call back or seek an in-person evaluation if the symptoms worsen or if the condition fails to improve as anticipated.  I provided 20 minutes of non-face-to-face time during this encounter.  Kathlee Nations, MD

## 2020-06-26 ENCOUNTER — Other Ambulatory Visit (HOSPITAL_COMMUNITY): Payer: Self-pay | Admitting: Psychiatry

## 2020-06-26 DIAGNOSIS — F319 Bipolar disorder, unspecified: Secondary | ICD-10-CM

## 2020-08-19 ENCOUNTER — Other Ambulatory Visit (HOSPITAL_COMMUNITY): Payer: Self-pay | Admitting: Psychiatry

## 2020-08-19 DIAGNOSIS — F411 Generalized anxiety disorder: Secondary | ICD-10-CM

## 2020-10-10 ENCOUNTER — Ambulatory Visit (HOSPITAL_COMMUNITY): Payer: Medicare HMO | Admitting: Psychiatry
